# Patient Record
Sex: Female | Born: 1975 | Race: White | Hispanic: No | Marital: Married | Smoking: Current every day smoker
Health system: Southern US, Community
[De-identification: ages and names within clinical notes are randomized; demographics above are authoritative.]

## PROBLEM LIST (undated history)

## (undated) DIAGNOSIS — R0600 Dyspnea, unspecified: Secondary | ICD-10-CM

## (undated) DIAGNOSIS — F988 Other specified behavioral and emotional disorders with onset usually occurring in childhood and adolescence: Secondary | ICD-10-CM

## (undated) DIAGNOSIS — F429 Obsessive-compulsive disorder, unspecified: Secondary | ICD-10-CM

## (undated) DIAGNOSIS — I10 Essential (primary) hypertension: Secondary | ICD-10-CM

## (undated) DIAGNOSIS — F319 Bipolar disorder, unspecified: Secondary | ICD-10-CM

## (undated) DIAGNOSIS — M51369 Other intervertebral disc degeneration, lumbar region without mention of lumbar back pain or lower extremity pain: Secondary | ICD-10-CM

## (undated) DIAGNOSIS — K449 Diaphragmatic hernia without obstruction or gangrene: Secondary | ICD-10-CM

## (undated) DIAGNOSIS — N809 Endometriosis, unspecified: Secondary | ICD-10-CM

## (undated) DIAGNOSIS — E0590 Thyrotoxicosis, unspecified without thyrotoxic crisis or storm: Secondary | ICD-10-CM

## (undated) DIAGNOSIS — J189 Pneumonia, unspecified organism: Secondary | ICD-10-CM

## (undated) DIAGNOSIS — D649 Anemia, unspecified: Secondary | ICD-10-CM

## (undated) DIAGNOSIS — J45909 Unspecified asthma, uncomplicated: Secondary | ICD-10-CM

## (undated) DIAGNOSIS — E7800 Pure hypercholesterolemia, unspecified: Secondary | ICD-10-CM

## (undated) DIAGNOSIS — D219 Benign neoplasm of connective and other soft tissue, unspecified: Secondary | ICD-10-CM

## (undated) DIAGNOSIS — M1990 Unspecified osteoarthritis, unspecified site: Secondary | ICD-10-CM

## (undated) DIAGNOSIS — M5136 Other intervertebral disc degeneration, lumbar region: Secondary | ICD-10-CM

## (undated) DIAGNOSIS — M5134 Other intervertebral disc degeneration, thoracic region: Secondary | ICD-10-CM

## (undated) DIAGNOSIS — R911 Solitary pulmonary nodule: Secondary | ICD-10-CM

## (undated) DIAGNOSIS — K219 Gastro-esophageal reflux disease without esophagitis: Secondary | ICD-10-CM

## (undated) HISTORY — DX: Unspecified asthma, uncomplicated: J45.909

## (undated) HISTORY — DX: Benign neoplasm of connective and other soft tissue, unspecified: D21.9

## (undated) HISTORY — DX: Thyrotoxicosis, unspecified without thyrotoxic crisis or storm: E05.90

## (undated) HISTORY — PX: UPPER GASTROINTESTINAL ENDOSCOPY: SHX188

## (undated) HISTORY — DX: Obsessive-compulsive disorder, unspecified: F42.9

## (undated) HISTORY — DX: Other intervertebral disc degeneration, thoracic region: M51.34

## (undated) HISTORY — PX: ABDOMINAL HYSTERECTOMY: SHX81

## (undated) HISTORY — PX: LAPAROSCOPIC ABDOMINAL EXPLORATION: SHX6249

## (undated) HISTORY — DX: Diaphragmatic hernia without obstruction or gangrene: K44.9

## (undated) HISTORY — PX: CHOLECYSTECTOMY: SHX55

## (undated) HISTORY — DX: Endometriosis, unspecified: N80.9

## (undated) HISTORY — DX: Pneumonia, unspecified organism: J18.9

## (undated) HISTORY — DX: Dyspnea, unspecified: R06.00

## (undated) HISTORY — DX: Other specified behavioral and emotional disorders with onset usually occurring in childhood and adolescence: F98.8

## (undated) HISTORY — DX: Other intervertebral disc degeneration, lumbar region: M51.36

## (undated) HISTORY — PX: COLONOSCOPY: SHX174

## (undated) HISTORY — DX: Anemia, unspecified: D64.9

## (undated) HISTORY — DX: Unspecified osteoarthritis, unspecified site: M19.90

## (undated) HISTORY — DX: Solitary pulmonary nodule: R91.1

## (undated) HISTORY — DX: Other intervertebral disc degeneration, lumbar region without mention of lumbar back pain or lower extremity pain: M51.369

---

## 2011-04-21 DIAGNOSIS — J40 Bronchitis, not specified as acute or chronic: Secondary | ICD-10-CM | POA: Insufficient documentation

## 2011-10-15 DIAGNOSIS — N898 Other specified noninflammatory disorders of vagina: Secondary | ICD-10-CM | POA: Insufficient documentation

## 2011-10-15 DIAGNOSIS — B3731 Acute candidiasis of vulva and vagina: Secondary | ICD-10-CM | POA: Insufficient documentation

## 2012-10-23 DIAGNOSIS — M51369 Other intervertebral disc degeneration, lumbar region without mention of lumbar back pain or lower extremity pain: Secondary | ICD-10-CM | POA: Insufficient documentation

## 2012-10-23 DIAGNOSIS — M5136 Other intervertebral disc degeneration, lumbar region: Secondary | ICD-10-CM | POA: Insufficient documentation

## 2021-08-03 ENCOUNTER — Emergency Department (HOSPITAL_COMMUNITY): Payer: 59

## 2021-08-03 ENCOUNTER — Emergency Department (HOSPITAL_COMMUNITY)
Admission: EM | Admit: 2021-08-03 | Discharge: 2021-08-03 | Disposition: A | Discharge status: Home / Self Care | Payer: 59 | Attending: Emergency Medicine | Admitting: Emergency Medicine

## 2021-08-03 ENCOUNTER — Encounter (HOSPITAL_COMMUNITY): Payer: Self-pay | Admitting: *Deleted

## 2021-08-03 ENCOUNTER — Other Ambulatory Visit: Payer: Self-pay

## 2021-08-03 DIAGNOSIS — R112 Nausea with vomiting, unspecified: Secondary | ICD-10-CM | POA: Diagnosis present

## 2021-08-03 DIAGNOSIS — U071 COVID-19: Secondary | ICD-10-CM | POA: Diagnosis not present

## 2021-08-03 DIAGNOSIS — D72829 Elevated white blood cell count, unspecified: Secondary | ICD-10-CM | POA: Insufficient documentation

## 2021-08-03 DIAGNOSIS — R197 Diarrhea, unspecified: Secondary | ICD-10-CM

## 2021-08-03 DIAGNOSIS — K2100 Gastro-esophageal reflux disease with esophagitis, without bleeding: Secondary | ICD-10-CM | POA: Insufficient documentation

## 2021-08-03 HISTORY — DX: Pure hypercholesterolemia, unspecified: E78.00

## 2021-08-03 HISTORY — DX: Gastro-esophageal reflux disease without esophagitis: K21.9

## 2021-08-03 HISTORY — DX: Bipolar disorder, unspecified: F31.9

## 2021-08-03 LAB — CBC
HCT: 44.9 % (ref 36.0–46.0)
Hemoglobin: 15.7 g/dL — ABNORMAL HIGH (ref 12.0–15.0)
MCH: 29.8 pg (ref 26.0–34.0)
MCHC: 35 g/dL (ref 30.0–36.0)
MCV: 85.4 fL (ref 80.0–100.0)
Platelets: 403 10*3/uL — ABNORMAL HIGH (ref 150–400)
RBC: 5.26 MIL/uL — ABNORMAL HIGH (ref 3.87–5.11)
RDW: 12.5 % (ref 11.5–15.5)
WBC: 16.3 10*3/uL — ABNORMAL HIGH (ref 4.0–10.5)
nRBC: 0 % (ref 0.0–0.2)

## 2021-08-03 LAB — BASIC METABOLIC PANEL
Anion gap: 12 (ref 5–15)
BUN: 9 mg/dL (ref 6–20)
CO2: 24 mmol/L (ref 22–32)
Calcium: 9.6 mg/dL (ref 8.9–10.3)
Chloride: 102 mmol/L (ref 98–111)
Creatinine, Ser: 0.75 mg/dL (ref 0.44–1.00)
GFR, Estimated: >60 mL/min (ref >60)
Glucose, Bld: 124 mg/dL — ABNORMAL HIGH (ref 70–99)
Potassium: 3.6 mmol/L (ref 3.5–5.1)
Sodium: 138 mmol/L (ref 135–145)

## 2021-08-03 LAB — HEPATIC FUNCTION PANEL
ALT: 20 U/L (ref 0–44)
AST: 20 U/L (ref 15–41)
Albumin: 4.7 g/dL (ref 3.5–5.0)
Alkaline Phosphatase: 58 U/L (ref 38–126)
Bilirubin, Direct: 0.1 mg/dL (ref 0.0–0.2)
Indirect Bilirubin: 0.4 mg/dL (ref 0.3–0.9)
Total Bilirubin: 0.5 mg/dL (ref 0.3–1.2)
Total Protein: 8.2 g/dL — ABNORMAL HIGH (ref 6.5–8.1)

## 2021-08-03 LAB — I-STAT BETA HCG BLOOD, ED (MC, WL, AP ONLY): I-stat hCG, quantitative: <5 m[IU]/mL (ref <5)

## 2021-08-03 LAB — TROPONIN I (HIGH SENSITIVITY)
Troponin I (High Sensitivity): 3 ng/L (ref <18)
Troponin I (High Sensitivity): 4 ng/L (ref <18)

## 2021-08-03 LAB — D-DIMER, QUANTITATIVE: D-Dimer, Quant: 0.33 ug/mL-FEU (ref 0.00–0.50)

## 2021-08-03 LAB — LIPASE, BLOOD: Lipase: 27 U/L (ref 11–51)

## 2021-08-03 MED ORDER — METOCLOPRAMIDE HCL 5 MG/ML IJ SOLN
10.0000 mg | Freq: Once | INTRAMUSCULAR | Status: AC
  Administered 2021-08-03: 10 mg via INTRAVENOUS
  Filled 2021-08-03: qty 2

## 2021-08-03 MED ORDER — ONDANSETRON 4 MG PO TBDP: 4.0000 mg | ORAL_TABLET | Freq: Three times a day (TID) | ORAL | 0 refills | Status: DC | PRN

## 2021-08-03 MED ORDER — LACTATED RINGERS IV BOLUS
1000.0000 mL | Freq: Once | INTRAVENOUS | Status: AC
  Administered 2021-08-03: 1000 mL via INTRAVENOUS

## 2021-08-03 MED ORDER — PANTOPRAZOLE SODIUM 40 MG IV SOLR
40.0000 mg | Freq: Once | INTRAVENOUS | Status: AC
  Administered 2021-08-03: 40 mg via INTRAVENOUS
  Filled 2021-08-03: qty 40

## 2021-08-03 MED ORDER — OMEPRAZOLE 20 MG PO CPDR: 20.0000 mg | DELAYED_RELEASE_CAPSULE | Freq: Every day | ORAL | 0 refills | Status: DC

## 2021-08-03 MED ORDER — ALUM & MAG HYDROXIDE-SIMETH 200-200-20 MG/5ML PO SUSP
30.0000 mL | Freq: Once | ORAL | Status: AC
  Administered 2021-08-03: 30 mL via ORAL
  Filled 2021-08-03: qty 30

## 2021-08-03 MED ORDER — LIDOCAINE VISCOUS HCL 2 % MT SOLN
15.0000 mL | Freq: Once | OROMUCOSAL | Status: AC
  Administered 2021-08-03: 15 mL via ORAL
  Filled 2021-08-03: qty 15

## 2021-08-03 NOTE — ED Notes (Signed)
Pt given ginger ale for po challenge per dr Maryan Rued

## 2021-08-03 NOTE — Discharge Instructions (Addendum)
Your blood test look normal today except that you were dehydrated.  Everything with your heart was normal on the blood work.  Your x-ray did not show any signs of anything unusual.  Unclear why you continue to have so much nausea vomiting and diarrhea.  Could be related to COVID.  You are being started on a nausea medication and an antacid.  You will take the antacid daily for the next month.  Also be important for you to follow-up with your regular doctor.  If you start having fever, inability to hold anything down, inability to catch her breath, passing out or other concerns please return to the emergency room.

## 2021-08-03 NOTE — ED Triage Notes (Signed)
PT here via [facility] for sternal chest pain she describes as burning.  Since then she has vomited almost every day.

## 2021-08-03 NOTE — ED Provider Notes (Signed)
[facility] EMERGENCY DEPARTMENT Provider Note   CSN: [id number] Arrival date & time: 08/03/21  [id number]     History Chief Complaint  Patient presents with   Chest Pain    Wanda Mays is a 45 y.o. female.  Patient is a 45 year old female with a history of high cholesterol, GERD, bipolar disease who is presenting today with several complaints.  Patient reports she had COVID at the end of June and since that time she has had vomiting and diarrhea every day for the last 6-8 weeks.  It varies depending on the day how many episodes she has but for the last 2 days it has been severe and she has been dry heaving and vomiting up anything she tries to eat or drink.  Also for the last 2 days she has noticed chest discomfort in the center of her chest and up into her left shoulder that she describes as a burning that seems to be worse when she is active but also notices it when she sits and lays down.  She has not been able to eat.  She has noticed significant lightheadedness if she tries to stand or walk around and feels generally fatigued.  sHe denies any blood in her emesis and occasionally will have blood streaks in her stool but has a history of hemorrhoids.  She denies any urinary complaints.  She has a burning in her upper abdomen as well.  sHe denies taking aspirin products or OCPs.  She does use tobacco daily but no drugs or alcohol.  Her mother does have a clotting disease and has had close blood clots in the past.  Also her father at the age of his 64s had his first heart attack.  She has no known heart disease.  She does report always having stomach issues but has not seen anybody and does not take any medication for it regularly.  The history is provided by the patient.  Chest Pain     Past Medical History:  Diagnosis Date   Bipolar 1 disorder ([place])    GERD (gastroesophageal reflux disease)    Hypercholesteremia     There are no problems to display for this  patient.   Past Surgical History:  Procedure Laterality Date   ABDOMINAL HYSTERECTOMY     CESAREAN SECTION     CHOLECYSTECTOMY       OB History   No obstetric history on file.     No family history on file.  Social History   Substance Use Topics   Alcohol use: Not Currently   Drug use: Not Currently    Home Medications Prior to Admission medications   Medication Sig Start Date End Date Taking? Authorizing Provider  diphenhydramine-acetaminophen (TYLENOL PM) 25-500 MG TABS tablet Take 1 tablet by mouth at bedtime as needed (sleep).   Yes [provider]    Allergies    Aspirin and Penicillins  Review of Systems   Review of Systems  Cardiovascular:  Positive for chest pain.  All other systems reviewed and are negative.  Physical Exam Updated Vital Signs BP (!) 144/91 (BP Location: Left Arm)   Pulse 88   Temp 98.2 F (36.8 C) (Oral)   Resp 18   Ht '5\' 3"'$  (1.6 m)   Wt 90.7 kg   SpO2 94%   BMI 35.43 kg/m   Physical Exam Vitals and nursing note reviewed.  Constitutional:      General: She is not in acute distress.  Appearance: She is well-developed.  HENT:     Head: Normocephalic and atraumatic.     Mouth/Throat:     Mouth: Mucous membranes are dry.  Eyes:     Pupils: Pupils are equal, round, and reactive to light.  Cardiovascular:     Rate and Rhythm: Normal rate and regular rhythm.     Heart sounds: Normal heart sounds. No murmur heard.   No friction rub.  Pulmonary:     Effort: Pulmonary effort is normal.     Breath sounds: Normal breath sounds. No wheezing or rales.  Abdominal:     General: Bowel sounds are normal. There is no distension.     Palpations: Abdomen is soft.     Tenderness: There is abdominal tenderness in the right upper quadrant and epigastric area. There is no guarding or rebound.  Musculoskeletal:        General: No tenderness. Normal range of motion.     Cervical back: Normal range of motion and neck supple.      Right lower leg: No edema.     Left lower leg: No edema.     Comments: No edema  Skin:    General: Skin is warm and dry.     Capillary Refill: Capillary refill takes less than 2 seconds.     Findings: No rash.  Neurological:     Mental Status: She is alert and oriented to person, place, and time. Mental status is at baseline.     Cranial Nerves: No cranial nerve deficit.  Psychiatric:        Mood and Affect: Mood normal.        Behavior: Behavior normal.    ED Results / Procedures / Treatments   Labs (all labs ordered are listed, but only abnormal results are displayed) Labs Reviewed  BASIC METABOLIC PANEL - Abnormal; Notable for the following components:      Result Value   Glucose, Bld 124 (*)    All other components within normal limits  CBC - Abnormal; Notable for the following components:   WBC 16.3 (*)    RBC 5.26 (*)    Hemoglobin 15.7 (*)    Platelets 403 (*)    All other components within normal limits  HEPATIC FUNCTION PANEL - Abnormal; Notable for the following components:   Total Protein 8.2 (*)    All other components within normal limits  LIPASE, BLOOD  D-DIMER, QUANTITATIVE  I-STAT BETA HCG BLOOD, ED ([facility], [facility], [facility] ONLY)  TROPONIN I (HIGH SENSITIVITY)  TROPONIN I (HIGH SENSITIVITY)    EKG EKG Interpretation  Date/Time:  Wednesday August 03 2021 07:24:11 EDT Ventricular Rate:  111 PR Interval:  138 QRS Duration: 80 QT Interval:  328 QTC Calculation: 446 R Axis:   29 Text Interpretation: Sinus tachycardia Cannot rule out Anterior infarct , age undetermined T wave abnormality, consider inferior ischemia No previous tracing Confirmed by Blanchie Dessert [id number]) on 08/03/2021 3:43:09 PM  Radiology DG Chest 2 View  Result Date: 08/03/2021 CLINICAL DATA:  Chest pain the EXAM: CHEST - 2 VIEW COMPARISON:  None. FINDINGS: The heart size and mediastinal contours are within normal limits. Both lungs are clear. The visualized skeletal structures and abdomen are  unremarkable. IMPRESSION: No acute cardiopulmonary process. Electronically Signed   By: Merilyn Baba MD   On: 08/03/2021 08:10    Procedures Procedures   Medications Ordered in ED Medications  lactated ringers bolus 1,000 mL (has no administration in time range)  metoCLOPramide (REGLAN)  injection 10 mg (has no administration in time range)  pantoprazole (PROTONIX) injection 40 mg (has no administration in time range)  alum & mag hydroxide-simeth (MAALOX/MYLANTA) 200-200-20 MG/5ML suspension 30 mL (has no administration in time range)    And  lidocaine (XYLOCAINE) 2 % viscous mouth solution 15 mL (has no administration in time range)    ED Course  I have reviewed the triage vital signs and the nursing notes.  Pertinent labs & imaging results that were available during my care of the patient were reviewed by me and considered in my medical decision making (see chart for details).    MDM Rules/Calculators/A&P                           Patient is a 45 year old female presenting today with nonspecific chest discomfort in the setting of nausea vomiting and diarrhea for the last 6 to 8 weeks.  In the last 2 days she has had significant dry heaving and unable to hold anything down.  She does describe a component of exertion as she feels that the chest pain is there when she gets up and walks around but also has it when she feels stressed and when she lays down.  She is also had some shortness of breath in the last 2 days.  She denies any new cough, fever.  She has no history of cardiac disease but her father did have heart disease at a young age.  She does use tobacco so does have some risk factor.  CXR wnl.  CMP wnl.  Delta Trop wnl.  EKG with nonspecific t-wave inversion without old to compare.  CBC with leukocytosis of 16,000 of unknown significance.  Mildly hemoconcentrated with a hemoglobin of 15.  Platelets within normal limits.  Given patient's mother's history of a clotting disorder and  having recent COVID we will check a D-dimer.  Patient is not wheezing on exam and low suspicion for pneumonia.  No EKG finding to suggest pericarditis and low likelihood of myocarditis.  Patient has no evidence of fluid overload at this time.  Suspect patient's symptoms today are related to GI. Will treat with fluids, ppi, gi cocktail and anti-emetic.  6:32 PM Patient feeling much better after the meds.  Tolerating p.o.'s.  At this time low suspicion for ACS.  Suspect a GI origin.  We will start patient on a PPI and given antiemetic.  Will get follow-up with a PCP and will need to see GI in the future.  MDM   Amount and/or Complexity of Data Reviewed Clinical lab tests: ordered and reviewed Tests in the radiology section of CPT: ordered and reviewed Tests in the medicine section of CPT: ordered and reviewed Independent visualization of images, tracings, or specimens: yes    Final Clinical Impression(s) / ED Diagnoses Final diagnoses:  Gastroesophageal reflux disease with esophagitis without hemorrhage  Nausea vomiting and diarrhea    Rx / DC Orders ED Discharge Orders          Ordered    omeprazole (PRILOSEC) 20 MG capsule  Daily        08/03/21 1835    ondansetron (ZOFRAN ODT) 4 MG disintegrating tablet  Every 8 hours PRN        08/03/21 1835             Blanchie Dessert, MD 08/03/21 1836

## 2021-08-03 NOTE — Care Management (Signed)
ED RN Care Manager met with patient and spouse in [place] to discuss assisting patient with ED f/u.  Patient reports having [employer], patient was advised to call [facility] customer service on back of card to find a Provider in network. Patient verbalized understanding. No further ED Care Management needs identified.

## 2021-08-05 ENCOUNTER — Telehealth: Payer: 59 | Admitting: Nurse Practitioner

## 2021-08-05 ENCOUNTER — Other Ambulatory Visit: Payer: Self-pay | Admitting: Nurse Practitioner

## 2021-08-05 DIAGNOSIS — U099 Post covid-19 condition, unspecified: Secondary | ICD-10-CM

## 2021-08-05 MED ORDER — LEVALBUTEROL TARTRATE 45 MCG/ACT IN AERO: 2.0000 | INHALATION_SPRAY | RESPIRATORY_TRACT | 1 refills | Status: DC | PRN

## 2021-08-05 NOTE — Progress Notes (Signed)
Virtual Visit Consent   Wanda Mays, you are scheduled for a virtual visit with Wanda Mays, [place], a [facility] provider, today.     Just as with appointments in the office, your consent must be obtained to participate.  Your consent will be active for this visit and any virtual visit you may have with one of our providers in the next 365 days.     If you have a MyChart account, a copy of this consent can be sent to you electronically.  All tele[hone visits are billed to your insurance company just like a traditional visit in the office.    As this is a virtual visit, video technology does not allow for your provider to perform a traditional examination.  This may limit your provider's ability to fully assess your condition.  If your provider identifies any concerns that need to be evaluated in person or the need to arrange testing (such as labs, EKG, etc.), we will make arrangements to do so.     Although advances in technology are sophisticated, we cannot ensure that it will always work on either your end or our end.  If the connection with a video visit is poor, the visit may have to be switched to a telephone visit.  With either a video or telephone visit, we are not always able to ensure that we have a secure connection.     I need to obtain your verbal consent now.   Are you willing to proceed with your visit today? YES   Wanda Mays has provided verbal consent on 08/05/2021 for a virtual visit (video or telephone).   Wanda Hassell Done, FNP   Date: 08/05/2021 4:44 PM   Virtual Visit via Video Note   I, Wanda Mays, connected with Wanda Mays ([id number], 44-Nov-1977) on 08/05/21 at  by a video-enabled telemedicine application and verified that I am speaking with the correct person using two identifiers.  Location: Patient: Virtual Visit Location Patient: Home Provider: Virtual Visit Location Provider: Mobile   I discussed the limitations of evaluation and  management by telemedicine and the availability of in person appointments. The patient expressed understanding and agreed to proceed.    History of Present Illness: Wanda Mays is a 45 y.o. who identifies as a female who was assigned female at birth, and is being seen today for covid symptoms.  HPI: Patient had covid in June 2022 and has been symptomatic since. She went to the ED and they told her she needed to get follow up with PCP. She is still having nausea, vomiting, sob and heart palpitations. The ED gave her zofran and told her she needs PCP.patient also needs note for work. Review of Systems  Constitutional:  Positive for malaise/fatigue. Negative for chills and fever.  HENT:  Positive for congestion and sore throat.   Respiratory:  Positive for cough.   Musculoskeletal:  Positive for myalgias.  Neurological:  Negative for headaches.  Psychiatric/Behavioral: Negative.    All other systems reviewed and are negative.  Problems:   Allergies:  Allergies  Allergen Reactions   Aspirin    Penicillins    Medications:  Current Outpatient Medications:    diphenhydramine-acetaminophen (TYLENOL PM) 25-500 MG TABS tablet, Take 1 tablet by mouth at bedtime as needed (sleep)., Disp: , Rfl:    omeprazole (PRILOSEC) 20 MG capsule, Take 1 capsule (20 mg total) by mouth daily., Disp: 20 capsule, Rfl: 0   ondansetron (ZOFRAN ODT) 4 MG disintegrating tablet, Take 1 tablet (  4 mg total) by mouth every 8 (eight) hours as needed for nausea or vomiting., Disp: 20 tablet, Rfl: 0  Observations/Objective: Patient is well-developed, well-nourished in no acute distress.  Resting comfortably  at home.  Head is normocephalic, atraumatic.  No labored breathing.  Speech is clear and coherent with logical content.  Patient is alert and oriented at baseline.  Slight cough SOB with talking  Assessment and Plan:  Wanda Mays in today with chief complaint of No chief complaint on file.   1. Long  COVID Meds as prescribed Needs FMLA papers filled out Based on what you shared with me, I feel your condition warrants further evaluation and I recommend that you be seen for a face to face visit.  Please contact your primary care physician practice to be seen. Many offices offer virtual options to be seen via video if you are not comfortable going in person to a medical facility at this time.  NOTE: You will NOT be charged for this eVisit.  If you do not have a PCP, [facility] offers a free physician referral service available at [phone]. Our trained staff has the experience, knowledge and resources to put you in touch with a physician who is right for you.    If you are having a true medical emergency please call 911.   Your e-visit answers were reviewed by a board certified advanced clinical practitioner to complete your personal care plan.  Thank you for using e-Visits.    Follow Up Instructions: I discussed the assessment and treatment plan with the patient. The patient was provided an opportunity to ask questions and all were answered. The patient agreed with the plan and demonstrated an understanding of the instructions.  A copy of instructions were sent to the patient via MyChart.  The patient was advised to call back or seek an in-person evaluation if the symptoms worsen or if the condition fails to improve as anticipated.  Time:  I spent 15 minutes with the patient via telehealth technology discussing the above problems/concerns.    Wanda Hassell Done, FNP

## 2021-08-08 DIAGNOSIS — M5030 Other cervical disc degeneration, unspecified cervical region: Secondary | ICD-10-CM | POA: Insufficient documentation

## 2021-08-22 ENCOUNTER — Emergency Department (HOSPITAL_COMMUNITY)
Admission: EM | Admit: 2021-08-22 | Discharge: 2021-08-22 | Disposition: A | Discharge status: Home / Self Care | Payer: 59 | Attending: Emergency Medicine | Admitting: Emergency Medicine

## 2021-08-22 ENCOUNTER — Emergency Department (HOSPITAL_COMMUNITY): Payer: 59

## 2021-08-22 ENCOUNTER — Encounter (HOSPITAL_COMMUNITY): Payer: Self-pay | Admitting: *Deleted

## 2021-08-22 ENCOUNTER — Other Ambulatory Visit: Payer: Self-pay

## 2021-08-22 DIAGNOSIS — R0602 Shortness of breath: Secondary | ICD-10-CM | POA: Diagnosis not present

## 2021-08-22 DIAGNOSIS — Z20822 Contact with and (suspected) exposure to covid-19: Secondary | ICD-10-CM | POA: Diagnosis not present

## 2021-08-22 DIAGNOSIS — I10 Essential (primary) hypertension: Secondary | ICD-10-CM | POA: Diagnosis not present

## 2021-08-22 DIAGNOSIS — R000 Tachycardia, unspecified: Secondary | ICD-10-CM | POA: Insufficient documentation

## 2021-08-22 DIAGNOSIS — R5383 Other fatigue: Secondary | ICD-10-CM | POA: Insufficient documentation

## 2021-08-22 DIAGNOSIS — R112 Nausea with vomiting, unspecified: Secondary | ICD-10-CM | POA: Diagnosis not present

## 2021-08-22 DIAGNOSIS — R079 Chest pain, unspecified: Secondary | ICD-10-CM | POA: Diagnosis present

## 2021-08-22 DIAGNOSIS — R531 Weakness: Secondary | ICD-10-CM | POA: Diagnosis not present

## 2021-08-22 LAB — CBC
HCT: 46.1 % — ABNORMAL HIGH (ref 36.0–46.0)
Hemoglobin: 15.7 g/dL — ABNORMAL HIGH (ref 12.0–15.0)
MCH: 29.3 pg (ref 26.0–34.0)
MCHC: 34.1 g/dL (ref 30.0–36.0)
MCV: 86.2 fL (ref 80.0–100.0)
Platelets: 509 10*3/uL — ABNORMAL HIGH (ref 150–400)
RBC: 5.35 MIL/uL — ABNORMAL HIGH (ref 3.87–5.11)
RDW: 13.3 % (ref 11.5–15.5)
WBC: 11.2 10*3/uL — ABNORMAL HIGH (ref 4.0–10.5)
nRBC: 0 % (ref 0.0–0.2)

## 2021-08-22 LAB — BASIC METABOLIC PANEL
Anion gap: 15 (ref 5–15)
BUN: 15 mg/dL (ref 6–20)
CO2: 19 mmol/L — ABNORMAL LOW (ref 22–32)
Calcium: 10.9 mg/dL — ABNORMAL HIGH (ref 8.9–10.3)
Chloride: 101 mmol/L (ref 98–111)
Creatinine, Ser: 1.33 mg/dL — ABNORMAL HIGH (ref 0.44–1.00)
GFR, Estimated: 51 mL/min — ABNORMAL LOW (ref >60)
Glucose, Bld: 117 mg/dL — ABNORMAL HIGH (ref 70–99)
Potassium: 3.5 mmol/L (ref 3.5–5.1)
Sodium: 135 mmol/L (ref 135–145)

## 2021-08-22 LAB — I-STAT BETA HCG BLOOD, ED (MC, WL, AP ONLY): I-stat hCG, quantitative: <5 m[IU]/mL (ref <5)

## 2021-08-22 LAB — RESP PANEL BY RT-PCR (FLU A&B, COVID) ARPGX2
Influenza A by PCR: NEGATIVE
Influenza B by PCR: NEGATIVE
SARS Coronavirus 2 by RT PCR: NEGATIVE

## 2021-08-22 LAB — TROPONIN I (HIGH SENSITIVITY)
Troponin I (High Sensitivity): 5 ng/L (ref <18)
Troponin I (High Sensitivity): 3 ng/L (ref <18)

## 2021-08-22 LAB — D-DIMER, QUANTITATIVE: D-Dimer, Quant: 0.31 ug/mL-FEU (ref 0.00–0.50)

## 2021-08-22 MED ORDER — SODIUM CHLORIDE 0.9 % IV BOLUS
1000.0000 mL | Freq: Once | INTRAVENOUS | Status: AC
  Administered 2021-08-22: 1000 mL via INTRAVENOUS

## 2021-08-22 NOTE — ED Provider Notes (Signed)
[facility] EMERGENCY DEPARTMENT Provider Note   CSN: [id number] Arrival date & time: 08/22/21  1314     History Chief Complaint  Patient presents with   Shortness of Breath   Chest Pain   Hypertension    Wanda Mays is a 45 y.o. female.  45 year old female with prior medical history as detailed below presents for evaluation.  Patient complains of persistent tachycardia and shortness of breath.  Patient with recent history of COVID infection in June.  Patient reports symptoms concerning for possible long COVID.  She complains of persistent weakness and fatigue.  This is associated with intermittent episodes of tachycardia and dyspnea.  Today symptoms are worse.  She reports that she has noticed recent elevations in her blood pressure.  She reports recent nausea and vomiting.  She denies recent fever.  The history is provided by the patient.  Shortness of Breath Severity:  Moderate Onset quality:  Unable to specify Timing:  Unable to specify Progression:  Unable to specify Chronicity:  Recurrent Context: activity   Associated symptoms: chest pain   Chest Pain Associated symptoms: shortness of breath   Hypertension Associated symptoms include chest pain and shortness of breath.      Past Medical History:  Diagnosis Date   Bipolar 1 disorder ([place])    GERD (gastroesophageal reflux disease)    Hypercholesteremia     There are no problems to display for this patient.   Past Surgical History:  Procedure Laterality Date   ABDOMINAL HYSTERECTOMY     CESAREAN SECTION     CHOLECYSTECTOMY       OB History   No obstetric history on file.     No family history on file.  Social History   Substance Use Topics   Alcohol use: Not Currently   Drug use: Not Currently    Home Medications Prior to Admission medications   Medication Sig Start Date End Date Taking? Authorizing Provider  albuterol (VENTOLIN HFA) 108 (90 Base) MCG/ACT inhaler Inhale 2  puffs into the lungs every 6 (six) hours as needed for wheezing or shortness of breath. 08/05/21   Hassell Done, Mary-Margaret, FNP  diphenhydramine-acetaminophen (TYLENOL PM) 25-500 MG TABS tablet Take 1 tablet by mouth at bedtime as needed (sleep).    [provider]  omeprazole (PRILOSEC) 20 MG capsule Take 1 capsule (20 mg total) by mouth daily. 08/03/21   Blanchie Dessert, MD  ondansetron (ZOFRAN ODT) 4 MG disintegrating tablet Take 1 tablet (4 mg total) by mouth every 8 (eight) hours as needed for nausea or vomiting. 08/03/21   Blanchie Dessert, MD    Allergies    Aspirin and Penicillins  Review of Systems   Review of Systems  Respiratory:  Positive for shortness of breath.   Cardiovascular:  Positive for chest pain.  All other systems reviewed and are negative.  Physical Exam Updated Vital Signs BP (!) 153/97 (BP Location: Left Arm)   Pulse (!) 144   Temp 98.1 F (36.7 C) (Oral)   Resp (!) 26   Ht '5\' 3"'$  (1.6 m)   Wt 90.7 kg   SpO2 95%   BMI 35.43 kg/m   Physical Exam Vitals and nursing note reviewed.  Constitutional:      General: She is not in acute distress.    Appearance: Normal appearance. She is well-developed.  HENT:     Head: Normocephalic and atraumatic.  Eyes:     Conjunctiva/sclera: Conjunctivae normal.     Pupils: Pupils are equal,  round, and reactive to light.  Cardiovascular:     Rate and Rhythm: Normal rate and regular rhythm.     Heart sounds: Normal heart sounds.  Pulmonary:     Effort: Pulmonary effort is normal. Tachypnea present. No respiratory distress.     Breath sounds: Normal breath sounds.  Abdominal:     General: There is no distension.     Palpations: Abdomen is soft.     Tenderness: There is no abdominal tenderness.  Musculoskeletal:        General: No deformity. Normal range of motion.     Cervical back: Normal range of motion and neck supple.  Skin:    General: Skin is warm and dry.  Neurological:     General: No focal  deficit present.     Mental Status: She is alert and oriented to person, place, and time.    ED Results / Procedures / Treatments   Labs (all labs ordered are listed, but only abnormal results are displayed) Labs Reviewed  BASIC METABOLIC PANEL - Abnormal; Notable for the following components:      Result Value   CO2 19 (*)    Glucose, Bld 117 (*)    Creatinine, Ser 1.33 (*)    Calcium 10.9 (*)    GFR, Estimated 51 (*)    All other components within normal limits  CBC - Abnormal; Notable for the following components:   WBC 11.2 (*)    RBC 5.35 (*)    Hemoglobin 15.7 (*)    HCT 46.1 (*)    Platelets 509 (*)    All other components within normal limits  RESP PANEL BY RT-PCR (FLU A&B, COVID) ARPGX2  D-DIMER, QUANTITATIVE  I-STAT BETA HCG BLOOD, ED ([facility], [facility], [facility] ONLY)  TROPONIN I (HIGH SENSITIVITY)  TROPONIN I (HIGH SENSITIVITY)    EKG EKG Interpretation  Date/Time:  Monday August 22 2021 16:30:34 EDT Ventricular Rate:  89 PR Interval:  143 QRS Duration: 73 QT Interval:  447 QTC Calculation: 544 R Axis:   64 Text Interpretation: Sinus rhythm Consider right atrial enlargement Borderline T abnormalities, diffuse leads Prolonged QT interval Confirmed by Elnora Morrison [phone]) on 08/22/2021 5:35:27 PM  Radiology DG Chest 2 View  Result Date: 08/22/2021 CLINICAL DATA:  Chest pain EXAM: CHEST - 2 VIEW COMPARISON:  08/03/2021 FINDINGS: The heart size and mediastinal contours are within normal limits. Both lungs are clear. Disc degenerative disease of the thoracic spine. IMPRESSION: No acute abnormality of the lungs. Electronically Signed   By: Eddie Candle M.D.   On: 08/22/2021 14:49    Procedures Procedures   Medications Ordered in ED Medications - No data to display  ED Course  I have reviewed the triage vital signs and the nursing notes.  Pertinent labs & imaging results that were available during my care of the patient were reviewed by me and considered in my medical  decision making (see chart for details).    MDM Rules/Calculators/A&P                           MDM  MSE complete  Wanda Mays was evaluated in Emergency Department on 08/22/2021 for the symptoms described in the history of present illness. She was evaluated in the context of the global COVID-19 pandemic, which necessitated consideration that the patient might be at risk for infection with the SARS-CoV-2 virus that causes COVID-19. Institutional protocols and algorithms that pertain to the evaluation of  patients at risk for COVID-19 are in a state of rapid change based on information released by regulatory bodies including the CDC and federal and state organizations. These policies and algorithms were followed during the patient's care in the ED.   Patient is presenting with complaint of recent increased tachycardia and increased dyspnea with exertion.  Patient reports COVID infection in June with symptoms following that that are concerning for possible development of long COVID.  Patient's presentation today is concerning for elevated heart rate with concurrent symptoms of dyspnea.  Patient is able to speak in full sentences.  She is without significant hypoxia.  Work-up will include basic labs, repeat COVID testing, CXR, D-dimer, and troponin.  Pending dispo signed out to Dr. Reather Converse.   Final Clinical Impression(s) / ED Diagnoses Final diagnoses:  Acute chest pain  General weakness    Rx / DC Orders ED Discharge Orders     None        Valarie Merino, MD 08/23/21 [phone]

## 2021-08-22 NOTE — ED Provider Notes (Signed)
Patient care signed out to follow-up troponin and COVID test.  Patient presented with feeling generally weak, shortness of breath and heart racing.  COVID in June.  COVID test result reviewed negative.  Vital signs normalized no longer tachycardic, D-dimer negative.  2 troponins negative reviewed.  Patient improved in the emergency room and stable for outpatient follow-up with cardiology and primary doctor.  Labs Reviewed  BASIC METABOLIC PANEL - Abnormal; Notable for the following components:      Result Value   CO2 19 (*)    Glucose, Bld 117 (*)    Creatinine, Ser 1.33 (*)    Calcium 10.9 (*)    GFR, Estimated 51 (*)    All other components within normal limits  CBC - Abnormal; Notable for the following components:   WBC 11.2 (*)    RBC 5.35 (*)    Hemoglobin 15.7 (*)    HCT 46.1 (*)    Platelets 509 (*)    All other components within normal limits  RESP PANEL BY RT-PCR (FLU A&B, COVID) ARPGX2  D-DIMER, QUANTITATIVE  I-STAT BETA HCG BLOOD, ED ([facility], [facility], [facility] ONLY)  TROPONIN I (HIGH SENSITIVITY)  TROPONIN I (HIGH SENSITIVITY)   Golda Acre, MD 08/22/21 [phone]

## 2021-08-22 NOTE — ED Triage Notes (Signed)
To ED for eval of sob, htn, and cp. Pt states she is under the care of her pcp for htn. States over the past week she has been tracking her bp and today it was 209/130. Pt told she has long covid. Dx with covid 6/18.  States she has nausea and vomiting. Skin w/d. Speaks in full clear sentences. Allergic to asprin. Tachy in triage at 157

## 2021-08-22 NOTE — Discharge Instructions (Addendum)
Follow-up with local doctor and local heart doctor.  Return for new or worsening signs or symptoms. Use Tylenol every 4 hours as needed for pain.  No signs of heart attack on your blood work today.

## 2021-08-23 ENCOUNTER — Other Ambulatory Visit: Payer: Self-pay

## 2021-08-23 ENCOUNTER — Encounter (HOSPITAL_BASED_OUTPATIENT_CLINIC_OR_DEPARTMENT_OTHER): Payer: Self-pay | Admitting: Emergency Medicine

## 2021-08-23 ENCOUNTER — Emergency Department (HOSPITAL_BASED_OUTPATIENT_CLINIC_OR_DEPARTMENT_OTHER)
Admission: EM | Admit: 2021-08-23 | Discharge: 2021-08-23 | Disposition: A | Discharge status: Home / Self Care | Payer: 59 | Attending: Emergency Medicine | Admitting: Emergency Medicine

## 2021-08-23 DIAGNOSIS — R5383 Other fatigue: Secondary | ICD-10-CM | POA: Diagnosis present

## 2021-08-23 DIAGNOSIS — R000 Tachycardia, unspecified: Secondary | ICD-10-CM | POA: Diagnosis not present

## 2021-08-23 DIAGNOSIS — E860 Dehydration: Secondary | ICD-10-CM

## 2021-08-23 DIAGNOSIS — R030 Elevated blood-pressure reading, without diagnosis of hypertension: Secondary | ICD-10-CM | POA: Insufficient documentation

## 2021-08-23 DIAGNOSIS — Z8616 Personal history of COVID-19: Secondary | ICD-10-CM | POA: Insufficient documentation

## 2021-08-23 DIAGNOSIS — R531 Weakness: Secondary | ICD-10-CM | POA: Diagnosis not present

## 2021-08-23 DIAGNOSIS — F419 Anxiety disorder, unspecified: Secondary | ICD-10-CM | POA: Diagnosis not present

## 2021-08-23 DIAGNOSIS — R0602 Shortness of breath: Secondary | ICD-10-CM | POA: Diagnosis not present

## 2021-08-23 DIAGNOSIS — R079 Chest pain, unspecified: Secondary | ICD-10-CM | POA: Insufficient documentation

## 2021-08-23 LAB — COMPREHENSIVE METABOLIC PANEL
ALT: 77 U/L — ABNORMAL HIGH (ref 0–44)
AST: 22 U/L (ref 15–41)
Albumin: 5.5 g/dL — ABNORMAL HIGH (ref 3.5–5.0)
Alkaline Phosphatase: 70 U/L (ref 38–126)
Anion gap: 15 (ref 5–15)
BUN: 21 mg/dL — ABNORMAL HIGH (ref 6–20)
CO2: 22 mmol/L (ref 22–32)
Calcium: 10.1 mg/dL (ref 8.9–10.3)
Chloride: 101 mmol/L (ref 98–111)
Creatinine, Ser: 1.05 mg/dL — ABNORMAL HIGH (ref 0.44–1.00)
GFR, Estimated: >60 mL/min (ref >60)
Glucose, Bld: 112 mg/dL — ABNORMAL HIGH (ref 70–99)
Potassium: 3.6 mmol/L (ref 3.5–5.1)
Sodium: 138 mmol/L (ref 135–145)
Total Bilirubin: 0.7 mg/dL (ref 0.3–1.2)
Total Protein: 9.3 g/dL — ABNORMAL HIGH (ref 6.5–8.1)

## 2021-08-23 LAB — CBC WITH DIFFERENTIAL/PLATELET
Abs Immature Granulocytes: 0.05 10*3/uL (ref 0.00–0.07)
Basophils Absolute: 0.1 10*3/uL (ref 0.0–0.1)
Basophils Relative: 0 %
Eosinophils Absolute: 0 10*3/uL (ref 0.0–0.5)
Eosinophils Relative: 0 %
HCT: 44.2 % (ref 36.0–46.0)
Hemoglobin: 15.5 g/dL — ABNORMAL HIGH (ref 12.0–15.0)
Immature Granulocytes: 0 %
Lymphocytes Relative: 20 %
Lymphs Abs: 2.3 10*3/uL (ref 0.7–4.0)
MCH: 29.9 pg (ref 26.0–34.0)
MCHC: 35.1 g/dL (ref 30.0–36.0)
MCV: 85.2 fL (ref 80.0–100.0)
Monocytes Absolute: 0.9 10*3/uL (ref 0.1–1.0)
Monocytes Relative: 8 %
Neutro Abs: 8.3 10*3/uL — ABNORMAL HIGH (ref 1.7–7.7)
Neutrophils Relative %: 72 %
Platelets: 425 10*3/uL — ABNORMAL HIGH (ref 150–400)
RBC: 5.19 MIL/uL — ABNORMAL HIGH (ref 3.87–5.11)
RDW: 13.2 % (ref 11.5–15.5)
WBC: 11.5 10*3/uL — ABNORMAL HIGH (ref 4.0–10.5)
nRBC: 0 % (ref 0.0–0.2)

## 2021-08-23 LAB — URINALYSIS, ROUTINE W REFLEX MICROSCOPIC
Bilirubin Urine: NEGATIVE
Glucose, UA: NEGATIVE mg/dL
Hgb urine dipstick: NEGATIVE
Ketones, ur: 15 mg/dL — AB
Leukocytes,Ua: NEGATIVE
Nitrite: NEGATIVE
Protein, ur: NEGATIVE mg/dL
Specific Gravity, Urine: 1.03 (ref 1.005–1.030)
pH: 5 (ref 5.0–8.0)

## 2021-08-23 LAB — MAGNESIUM: Magnesium: 1.8 mg/dL (ref 1.7–2.4)

## 2021-08-23 MED ORDER — METOCLOPRAMIDE HCL 10 MG PO TABS
10.0000 mg | ORAL_TABLET | Freq: Once | ORAL | Status: AC
  Administered 2021-08-23: 10 mg via ORAL
  Filled 2021-08-23: qty 1

## 2021-08-23 MED ORDER — SODIUM CHLORIDE 0.9 % IV BOLUS
1000.0000 mL | Freq: Once | INTRAVENOUS | Status: AC
  Administered 2021-08-23: 1000 mL via INTRAVENOUS

## 2021-08-23 MED ORDER — METOCLOPRAMIDE HCL 10 MG PO TABS: 10.0000 mg | ORAL_TABLET | Freq: Three times a day (TID) | ORAL | 0 refills | Status: DC | PRN

## 2021-08-23 MED ORDER — HYDROXYZINE HCL 25 MG PO TABS
25.0000 mg | ORAL_TABLET | Freq: Once | ORAL | Status: AC
  Administered 2021-08-23: 25 mg via ORAL
  Filled 2021-08-23: qty 1

## 2021-08-23 MED ORDER — HYDROXYZINE HCL 25 MG PO TABS: 25.0000 mg | ORAL_TABLET | Freq: Three times a day (TID) | ORAL | 0 refills | Status: DC | PRN

## 2021-08-23 NOTE — ED Triage Notes (Signed)
Reports Left sided chest pain describes as a clawing sensation inside the chest.  [facility]  Was told she has long haul covid symptoms.  Had covid on 06/11/21.  Sent by PCP concerned about lab work.

## 2021-08-23 NOTE — ED Notes (Signed)
PT walked in room and did not go below 96% O2

## 2021-08-23 NOTE — ED Provider Notes (Signed)
[facility] EMERGENCY DEPARTMENT Provider Note   CSN: [id number] Arrival date & time: 08/23/21  1032     History Chief Complaint  Patient presents with   Chest Pain    Wanda Mays is a 45 y.o. female presenting for evaluation of fatigue and generalized weakness.   Pt states she has been feeling poorly since she had covid in June of this year (2 months ago). She is having daily vomiting (~5 episodes/day) and intermittent diarrhea. She also reports intermittent sob, cp, tachycardia, and elevated BP. Today she states she is feeling more fatigued and weak. She denies fevers, cough, abd pain. She was seen yesterday at [facility] ED, and d/c after a reassuring workup. Today she called the tele-doc she has been seeing recently, who was concerned about her WBC. She is on BP meds and cymbalta, no other medications. Sxs are worse with exertion/ambulation.   Additional history obtained from chart review. Reviewed labs and workup from yesterday, WBC of 11 (improved from  wks ago), and slight elevation in SCr at 1.3, c/w mild dehydration. Otherwise workup was very reassuring. Outside labs brought with pt shows normal TSH.   HPI     Past Medical History:  Diagnosis Date   Bipolar 1 disorder ([place])    GERD (gastroesophageal reflux disease)    Hypercholesteremia     There are no problems to display for this patient.   Past Surgical History:  Procedure Laterality Date   ABDOMINAL HYSTERECTOMY     CESAREAN SECTION     CHOLECYSTECTOMY       OB History   No obstetric history on file.     No family history on file.  Social History   Substance Use Topics   Alcohol use: Not Currently   Drug use: Not Currently    Home Medications Prior to Admission medications   Medication Sig Start Date End Date Taking? Authorizing Provider  hydrOXYzine (ATARAX/VISTARIL) 25 MG tablet Take 1 tablet (25 mg total) by mouth every 8 (eight) hours as needed. 08/23/21  Yes Naarah Borgerding, PA-C   metoCLOPramide (REGLAN) 10 MG tablet Take 1 tablet (10 mg total) by mouth every 8 (eight) hours as needed for nausea. 08/23/21  Yes Leaman Abe, PA-C  albuterol (VENTOLIN HFA) 108 (90 Base) MCG/ACT inhaler Inhale 2 puffs into the lungs every 6 (six) hours as needed for wheezing or shortness of breath. 08/05/21   Hassell Done, Mary-Margaret, FNP  diphenhydramine-acetaminophen (TYLENOL PM) 25-500 MG TABS tablet Take 1 tablet by mouth at bedtime as needed (sleep).    [provider]  omeprazole (PRILOSEC) 20 MG capsule Take 1 capsule (20 mg total) by mouth daily. 08/03/21   Blanchie Dessert, MD  ondansetron (ZOFRAN ODT) 4 MG disintegrating tablet Take 1 tablet (4 mg total) by mouth every 8 (eight) hours as needed for nausea or vomiting. 08/03/21   Blanchie Dessert, MD    Allergies    Aspirin and Penicillins  Review of Systems   Review of Systems  Respiratory:  Positive for shortness of breath.   Cardiovascular:  Positive for chest pain.  Gastrointestinal:  Positive for diarrhea, nausea and vomiting.  Neurological:  Positive for weakness.  All other systems reviewed and are negative.  Physical Exam Updated Vital Signs BP 129/85   Pulse 74   Temp 98.8 F (37.1 C)   Resp 15   Ht '5\' 3"'$  (1.6 m)   Wt 108.1 kg   SpO2 97%   BMI 42.23 kg/m   Physical Exam  Vitals and nursing note reviewed.  Constitutional:      General: She is not in acute distress.    Appearance: Normal appearance. She is obese.     Comments: Resting in the bed in NAD  HENT:     Head: Normocephalic and atraumatic.     Mouth/Throat:     Mouth: Mucous membranes are dry.     Comments: MM dry Eyes:     Extraocular Movements: Extraocular movements intact.     Conjunctiva/sclera: Conjunctivae normal.     Pupils: Pupils are equal, round, and reactive to light.  Cardiovascular:     Rate and Rhythm: Normal rate and regular rhythm.     Pulses: Normal pulses.  Pulmonary:     Effort: Pulmonary effort is normal. No  respiratory distress.     Breath sounds: Normal breath sounds. No wheezing.     Comments: Speaking in full sentences.  Clear lung sounds in all fields. Abdominal:     General: There is no distension.     Palpations: Abdomen is soft. There is no mass.     Tenderness: There is no abdominal tenderness. There is no guarding or rebound.  Musculoskeletal:        General: Normal range of motion.     Cervical back: Normal range of motion and neck supple.  Skin:    General: Skin is warm and dry.     Capillary Refill: Capillary refill takes less than 2 seconds.  Neurological:     Mental Status: She is alert and oriented to person, place, and time.  Psychiatric:        Mood and Affect: Mood and affect normal.        Speech: Speech normal.        Behavior: Behavior normal.    ED Results / Procedures / Treatments   Labs (all labs ordered are listed, but only abnormal results are displayed) Labs Reviewed  CBC WITH DIFFERENTIAL/PLATELET - Abnormal; Notable for the following components:      Result Value   WBC 11.5 (*)    RBC 5.19 (*)    Hemoglobin 15.5 (*)    Platelets 425 (*)    Neutro Abs 8.3 (*)    All other components within normal limits  COMPREHENSIVE METABOLIC PANEL - Abnormal; Notable for the following components:   Glucose, Bld 112 (*)    BUN 21 (*)    Creatinine, Ser 1.05 (*)    Total Protein 9.3 (*)    Albumin 5.5 (*)    ALT 77 (*)    All other components within normal limits  URINALYSIS, ROUTINE W REFLEX MICROSCOPIC - Abnormal; Notable for the following components:   APPearance CLOUDY (*)    Ketones, ur 15 (*)    All other components within normal limits  MAGNESIUM    EKG EKG Interpretation  Date/Time:  Tuesday August 23 2021 10:46:07 EDT Ventricular Rate:  102 PR Interval:  140 QRS Duration: 87 QT Interval:  343 QTC Calculation: 447 R Axis:   46 Text Interpretation: Sinus tachycardia Borderline repolarization abnormality since last tracing no significant change  Confirmed by Noemi Chapel [phone]) on 08/23/2021 10:53:52 AM  Radiology DG Chest 2 View  Result Date: 08/22/2021 CLINICAL DATA:  Chest pain EXAM: CHEST - 2 VIEW COMPARISON:  08/03/2021 FINDINGS: The heart size and mediastinal contours are within normal limits. Both lungs are clear. Disc degenerative disease of the thoracic spine. IMPRESSION: No acute abnormality of the lungs. Electronically Signed   By:  Eddie Candle M.D.   On: 08/22/2021 14:49    Procedures Procedures   Medications Ordered in ED Medications  sodium chloride 0.9 % bolus 1,000 mL (0 mLs Intravenous Stopped 08/23/21 1233)  hydrOXYzine (ATARAX/VISTARIL) tablet 25 mg (25 mg Oral Given 08/23/21 1314)  metoCLOPramide (REGLAN) tablet 10 mg (10 mg Oral Given 08/23/21 1314)    ED Course  I have reviewed the triage vital signs and the nursing notes.  Pertinent labs & imaging results that were available during my care of the patient were reviewed by me and considered in my medical decision making (see chart for details).    MDM Rules/Calculators/A&P                           Pt presenting for evaluation of intermittent cp, sob, htn, n/v/d, and generalized weakness. On exam, pt appears nontoxic. Had reassuring w/u yesterday. However in the setting of slightly worse SCr and continued n/v and generalized weakness, will recheck labs, give fluids, and check SpO2 ambulation/orthostatics. EKG unchanged from previous.   Orthostatics showed increased in HR with standing, c/w with dehydration. No change in BP. Continued mild, nonspecific leukocytosis elevation, but without infectious sxs and considering length of sxs, doubt sepsis or concerning bacterial infection. Cmp shows improved SCr. Discussed with pt. Discussed likely combination of dehydration, anxiety, and possibly long covid. Discussed symptomatic tx, importance of pcp f/u. social work consult placed for assistance with pcp. Resources given. At this time, pt appears safe for d/c. Return  precautions given. Pt states she understands and agrees to plan.    Final Clinical Impression(s) / ED Diagnoses Final diagnoses:  Dehydration  Anxiety    Rx / DC Orders ED Discharge Orders          Ordered    metoCLOPramide (REGLAN) 10 MG tablet  Every 8 hours PRN        08/23/21 1303    hydrOXYzine (ATARAX/VISTARIL) 25 MG tablet  Every 8 hours PRN        08/23/21 1303             Franchot Heidelberg, PA-C 08/23/21 1322    Noemi Chapel, MD 09/04/21 [phone]

## 2021-08-23 NOTE — Discharge Instructions (Addendum)
Take reglan as needed for nausea or vomiting. Make sure you are staying well hydrated with water.  Use hydroxyzine as needed for anxiety.  Call the office below to set up a follow up appointment., additionally, I have reached out to our social work team to help establish with primary care.  Return to the ER with any new, worsening, or concerning symptoms.

## 2021-08-23 NOTE — ED Notes (Signed)
Pt reports ongoing tachycardia onset approx 2 weeks ago. Pt also endorses ongoing chest pain with intermittent worsening. Pt with shob and intermittent dizziness.

## 2021-08-25 DIAGNOSIS — F319 Bipolar disorder, unspecified: Secondary | ICD-10-CM | POA: Insufficient documentation

## 2021-08-25 DIAGNOSIS — E7800 Pure hypercholesterolemia, unspecified: Secondary | ICD-10-CM | POA: Insufficient documentation

## 2021-08-25 DIAGNOSIS — K219 Gastro-esophageal reflux disease without esophagitis: Secondary | ICD-10-CM | POA: Insufficient documentation

## 2021-08-30 ENCOUNTER — Ambulatory Visit (HOSPITAL_COMMUNITY)
Admission: EM | Admit: 2021-08-30 | Discharge: 2021-08-30 | Disposition: A | Discharge status: Home / Self Care | Payer: 59 | Attending: Psychiatry | Admitting: Psychiatry

## 2021-08-30 ENCOUNTER — Other Ambulatory Visit: Payer: Self-pay

## 2021-08-30 ENCOUNTER — Ambulatory Visit (HOSPITAL_COMMUNITY)
Admission: RE | Admit: 2021-08-30 | Discharge: 2021-08-30 | Disposition: A | Discharge status: Home / Self Care | Payer: 59 | Attending: Psychiatry | Admitting: Psychiatry

## 2021-08-30 DIAGNOSIS — F411 Generalized anxiety disorder: Secondary | ICD-10-CM

## 2021-08-30 MED ORDER — DIPHENHYDRAMINE-APAP (SLEEP) 25-500 MG PO TABS: 1.0000 | ORAL_TABLET | Freq: Every evening | ORAL | Status: DC | PRN

## 2021-08-30 MED ORDER — HYDROXYZINE HCL 25 MG PO TABS: 25.0000 mg | ORAL_TABLET | Freq: Three times a day (TID) | ORAL | 0 refills | Status: DC | PRN

## 2021-08-30 NOTE — ED Provider Notes (Signed)
Behavioral Health Urgent Care Medical Screening Exam  Patient Name: Wanda Mays MRN: [id number] Date of Evaluation: 08/30/21 Chief Complaint:   Diagnosis:  Final diagnoses:  Generalized anxiety disorder    History of Present illness: Wanda Wanda is a 45 y.o. female patient presented to [facility] as a walk in alone with complaints of "I need help with my anxiety".  Patient reports she has a history of bipolar disorder, anxiety, ADD, and OCD.  Wanda Wanda, 45 y.o., female patient seen face to face by this provider, consulted with Dr. Serafina Mitchell; and chart reviewed on 08/30/21.  On evaluation Wanda Wanda reports she was seen this morning at [facility] as a walk in related to her anxiety. Reports she did not feel the visit was helpful. Reports she looked up [facility] online and thought maybe we could be of assistance.  During evaluation Wanda Wanda is in sitting position in no acute distress.  She is fairly groomed and makes good eye contact.  Her speech is clear, coherent, normal rate and tone.  She is alert and oriented x4.  Reports depression and anxiety and has a congruent affect.  Reports decrease in sleep and appetite.  Her thought process is coherent and relevant; There is no indication that she is currently responding to internal/external stimuli or experiencing delusional thought content.she denies auditory and visual hallucinations.  Denies self-harm/homicidal ideation.  Endorses passive suicidal ideations.  States, "I do not feel like I would hurt myself but I would not want my situation to worsen and I get to that place".  Patient contracts for safety.  Denies access to firearms/weapons.Patient has remained calm throughout assessment and has answered questions appropriately.    Patient reports she recently moved to [place] from [place] and does not have an outpatient provider.  States her PCP currently prescribes her Cymbalta but it does not help for anxiety.  Reports her anxiety is constant  and is causing her to have chest pain at times.  States she has been seen in multiple emergency rooms related to chest pain.  Reports she has a follow-up appointment with the cardiologist this week. Denies chest pain at this time. Vital signes are stable. During evaluation patient did not specifically ask for benzodiazepines, but did request medications for bipolar and anxiety. PDMP was negative for benzodiazapine's. Explained starting new medications and medication management would need to be done on an outpatient basis.  Explained to patient that this writer could provide her with 2 weeks worth of hydroxyzine to help with anxiety until she is able to see an outpatient provider. States she does have hydroxyzine but only has a few pills left.  Provided patient with a list of outpatient psychiatric providers.  Provided patient with information for safety health and [facility] for IOP/PHP.  At this time Wanda Wanda is educated and verbalizes understanding of mental health resources and other crisis services in the community.  She is instructed to call 911 and present to the nearest emergency room should she experience any suicidal/homicidal ideation, auditory/visual/hallucinations, or detrimental worsening of her mental health condition.     Psychiatric Specialty Exam  Presentation  General Appearance:Appropriate for Environment; Casual  Eye Contact:Good  Speech:Clear and Coherent; Normal Rate  Speech Volume:Normal  Handedness:Right   Mood and Affect  Mood:Anxious; Depressed  Affect:Congruent   Thought Process  Thought Processes:Coherent  Descriptions of Associations:Intact  Orientation:Full (Time, Place and Person)  Thought Content:Logical    Hallucinations:None  Ideas of Reference:None  Suicidal Thoughts:Yes, Passive Without  Intent; Without Plan; Without Means to Carry Out  Homicidal Thoughts:No   Sensorium  Memory:Immediate Good; Recent Good; Remote  Good  Judgment:Good  Insight:Good   Executive Functions  Concentration:Good  Attention Span:Good  [place]  Language:Good   Psychomotor Activity  Psychomotor Activity:Normal   Assets  Assets:Communication Skills; Desire for Improvement; Financial Resources/Insurance; Housing; Physical Health; Transportation   Sleep  Sleep:Fair  Number of hours:  No data recorded  No data recorded  Physical Exam: Physical Exam Vitals and nursing note reviewed.  Constitutional:      General: She is not in acute distress.    Appearance: Normal appearance. She is not ill-appearing.  HENT:     Head: Normocephalic.  Eyes:     General:        Right eye: No discharge.        Left eye: No discharge.     Conjunctiva/sclera: Conjunctivae normal.  Cardiovascular:     Rate and Rhythm: Normal rate.  Pulmonary:     Effort: Pulmonary effort is normal. No respiratory distress.  Musculoskeletal:        General: Normal range of motion.     Cervical back: Normal range of motion.  Skin:    Coloration: Skin is not jaundiced or pale.  Neurological:     Mental Status: She is alert and oriented to person, place, and time.  Psychiatric:        Attention and Perception: Attention and perception normal.        Mood and Affect: Mood is anxious and depressed.        Speech: Speech normal.        Behavior: Behavior normal. Behavior is cooperative.        Thought Content: Thought content includes suicidal ideation. Thought content does not include suicidal plan.        Cognition and Memory: Cognition normal.        Judgment: Judgment normal.   Review of Systems  Constitutional: Negative.  Negative for chills and fever.  HENT: Negative.  Negative for hearing loss.   Eyes: Negative.   Respiratory: Negative.  Negative for cough.   Cardiovascular: Negative.  Negative for chest pain.  Musculoskeletal: Negative.   Skin: Negative.   Neurological: Negative.    Psychiatric/Behavioral:  Positive for depression and suicidal ideas. The patient is nervous/anxious.   Blood pressure 136/80, pulse 74, temperature 97.9 F (36.6 C), temperature source Oral, resp. rate 18, SpO2 97 %. There is no height or weight on file to calculate BMI.  Musculoskeletal: Strength & Muscle Tone: within normal limits Gait & Station: normal Patient leans: N/A   [place] MSE Discharge Disposition for Follow up and Recommendations: Based on my evaluation the patient does not appear to have an emergency medical condition and can be discharged with resources and follow up care in outpatient services for Medication Management, Partial Hospitalization Program, and Group Therapy  Provided resources for [facility] and [facility] for IOP/PHP. Provided a list of outpatient psychiatric providers and therapy.  Sent prescription for hydroxyzine 25 mg p.o. 3 times daily as needed to patient's pharmacy. 42 tablets. Educated patient not to take medications with Tylenol PM or any other medication that may cause drowsiness.   No evidence of imminent risk to self or others at present.    Patient does not meet criteria for psychiatric inpatient admission. Discussed crisis plan, support from social network, calling 911, coming to the Emergency Department, and calling Suicide Hotline.  Revonda Humphrey, NP 08/30/2021, 11:05 AM

## 2021-08-30 NOTE — Discharge Instructions (Addendum)

## 2021-08-30 NOTE — BH Assessment (Signed)
@   0840, Clinician informed by Dr. Dwyane Dee that patient presented to [facility] as a walk-in, was already assessed by the provider Winifred Olive, NP), and discharged out the building. Therefore, TTS had no involvement.

## 2021-08-30 NOTE — Progress Notes (Signed)
   08/30/21 1003  [place] (Walk-ins at [facility] only)  How Did You Hear About [place]? Self  What Is the Reason for Your Visit/Call Today? 45 yo female presenting with severe anxiety and moderate depression, Pt denies SI, HI, AVH and drug use. Pt stated that she is paranoid since moving to [place] from [facility] in March 2021. She lives with her husband and 2 youngest children and states she is afraid to leave her home, afraid the enroll her children in school (homeschooled) and not sleeping. Pt stated she has not slept in 3 days. Hx of ADHD, Bipolar, GAD and OCD. Pt has been written out of work due to anxiety and was due to start back today but says she could not face it so came to [facility] instead  How Long Has This Been Causing You Problems? > than 6 months  Have You Recently Had Any Thoughts About Hurting Yourself? No (Pt reports thoughts that "things would be easier if I wasn't here." Denies any plans or intent due to having children. Hx of past attemtps over 10 years ago and IP care at that time.)  Are You Planning to [place] At This time? No  Have you Recently Had Thoughts About [place]? No  Are You Planning To Harm Someone At This Time? No  Are you currently experiencing any auditory, visual or other hallucinations? No  Have You Used Any Alcohol or Drugs in the Past 24 Hours? No  Do you have any current medical co-morbidities that require immediate attention? No  Clinician description of patient physical appearance/behavior: Pt is casually dressed and adequately groomed. Pt is pleasant and cooperative. Pt's movement , speech and thought content appear within normal limits. Pt's mood is moderately depression but with full range of emotions.  What Do You Feel Would Help You the Most Today? Treatment for Depression or other mood problem  If access to [facility] was not available, would you have sought care in the Emergency Department? Yes  Determination of Need Routine  (7 days)  Options For Referral Outpatient Therapy;Medication Management  Venus Gilles T. Mare Ferrari, [place], [facility], [facility] Triage Specialist [facility]

## 2021-08-30 NOTE — H&P (Addendum)
Behavioral Health Medical Screening Exam  Wanda Mays is a 45 y.o. female presents to [facility] behavioral health requesting something to help with her anxiety.  She reports a history of bipolar anxiety, ADD and OCD.  States she is currently followed by primary care provider where she is prescribed metoprolol 50 mg , Hctz 25 mg  and Cymbalta 60 mg daily.  She reports taking and tolerating medications well.  Patient reports she recently moved from [place] and has not had time to find outpatient provider.  States she is attempted to follow-up with multiple virtual providers without success. " They told me I was a risk because, I attempted suicide in the past." Reported last attempt was 2 years ago. Stated that she has been out of work since June due to [employer] and stress.   Currently denying suicidal or homicidal ideations.  Denies auditory or visual hallucinations.  She reports after recent diagnosis with COVID she has been seen at the local hospital due to chest palpitations and increased anxiety twice.  Discussed initiating hydroxyzine.  Patient declined.  Patient was offered partial hospitalization- she declined. " I have too much trauma for group setting." Patient was offered additional outpatient resources " this was a waste of time."    Patient reports she was referred to the local urgent care for medications.   Advised patient urgent care was a sister affiliate.  Patient to follow-up with outpatient providers.  Case staffed with attending psychiatrist  MD Dwyane Dee, support, encouragement and reassurances was provided.   Total Time spent with patient: 15 minutes  Psychiatric Specialty Exam:  Presentation  General Appearance: Appropriate for Environment; Casual  Eye Contact:Good  Speech:Clear and Coherent; Normal Rate  Speech Volume:Normal  Handedness: Right  Mood and Affect  Mood:Anxious; Depressed  Affect:Congruent   Thought Process  Thought Processes:Coherent  Descriptions of  Associations:Intact  Orientation:Full (Time, Place and Person)  Thought Content:Logical  History of Schizophrenia/Schizoaffective disorder:No data recorded Duration of Psychotic Symptoms:No data recorded Hallucinations:Hallucinations: None  Ideas of Reference:None  Suicidal Thoughts:Suicidal Thoughts: Yes, Passive SI Passive Intent and/or Plan: Without Intent; Without Plan; Without Means to Carry Out  Homicidal Thoughts:Homicidal Thoughts: No   Sensorium  Memory:Immediate Good; Recent Good; Remote Good  Judgment:Good  Insight:Good   Executive Functions  Concentration:Good  Attention Span:Good  [place] of Knowledge:Good  Language:Good   Psychomotor Activity  Psychomotor Activity:Psychomotor Activity: Normal   Assets  Assets:Communication Skills; Desire for Improvement; Financial Resources/Insurance; Housing; Physical Health; Transportation   Sleep  Sleep:Sleep: Fair    Physical Exam: Physical Exam Vitals reviewed.  HENT:     Head: Normocephalic.  Cardiovascular:     Rate and Rhythm: Normal rate and regular rhythm.  Pulmonary:     Effort: Pulmonary effort is normal.     Breath sounds: Normal breath sounds.  Abdominal:     Palpations: Abdomen is soft.  Neurological:     Mental Status: She is alert.  Psychiatric:        Attention and Perception: Attention and perception normal.        Mood and Affect: Mood normal.        Speech: Speech normal.        Behavior: Behavior normal. Behavior is cooperative.        Thought Content: Thought content normal.        Cognition and Memory: Cognition normal.        Judgment: Judgment normal.   ROS There were no vitals taken for this visit.  There is no height or weight on file to calculate BMI. 97.8, 120/84, 73 HR 18 RR vitals collected by RN D. Railey  Musculoskeletal: Strength & Muscle Tone: within normal limits Gait & Station: normal Patient leans: N/A   Recommendations: Keep follow-up  apptoiment with Primary Care Provider - Declined PHP/IOP,Vistaril  and additional outpatient resources Based on my evaluation the patient does not appear to have an emergency medical condition.  Derrill [place], NP 08/30/2021, 10:42 AM

## 2021-09-02 ENCOUNTER — Ambulatory Visit (INDEPENDENT_AMBULATORY_CARE_PROVIDER_SITE_OTHER): Payer: 59

## 2021-09-02 ENCOUNTER — Other Ambulatory Visit: Payer: Self-pay

## 2021-09-02 ENCOUNTER — Ambulatory Visit (INDEPENDENT_AMBULATORY_CARE_PROVIDER_SITE_OTHER): Payer: 59 | Admitting: Cardiology

## 2021-09-02 ENCOUNTER — Encounter: Payer: Self-pay | Admitting: Cardiology

## 2021-09-02 VITALS — BP 167/105 | HR 99 | Ht 63.0 in | Wt 215.1 lb

## 2021-09-02 DIAGNOSIS — R0602 Shortness of breath: Secondary | ICD-10-CM

## 2021-09-02 DIAGNOSIS — R0789 Other chest pain: Secondary | ICD-10-CM

## 2021-09-02 DIAGNOSIS — R002 Palpitations: Secondary | ICD-10-CM

## 2021-09-02 DIAGNOSIS — F32A Depression, unspecified: Secondary | ICD-10-CM

## 2021-09-02 DIAGNOSIS — F419 Anxiety disorder, unspecified: Secondary | ICD-10-CM

## 2021-09-02 DIAGNOSIS — F319 Bipolar disorder, unspecified: Secondary | ICD-10-CM

## 2021-09-02 MED ORDER — PROPRANOLOL HCL 40 MG PO TABS: 40.0000 mg | ORAL_TABLET | Freq: Three times a day (TID) | ORAL | 4 refills | Status: DC

## 2021-09-02 NOTE — Patient Instructions (Signed)
Medication Instructions:  Your physician has recommended you make the following change in your medication:  STOP: Lopressor START: Propranolol 40 mg every 8 hours *If you need a refill on your cardiac medications before your next appointment, please call your pharmacy*   Lab Work: Your physician recommends that you return for lab work in:  TODAY: TSH, BMET, [place] If you have labs (blood work) drawn today and your tests are completely normal, you will receive your results only by: [place] (if you have MyChart) OR A paper copy in the mail If you have any lab test that is abnormal or we need to change your treatment, we will call you to review the results.   Testing/Procedures: Your physician has requested that you have an echocardiogram. Echocardiography is a painless test that uses sound waves to create images of your heart. It provides your doctor with information about the size and shape of your heart and how well your heart's chambers and valves are working. This procedure takes approximately one hour. There are no restrictions for this procedure.  A zio monitor was ordered today. It will remain on for 3 days. You will then return monitor and event diary in provided box. It takes 1-2 weeks for report to be downloaded and returned to [place]. We will call you with the results. If monitor falls off or has orange flashing light, please call Zio for further instructions.   ZIO  WHY IS MY DOCTOR PRESCRIBING ZIO? The Zio system is proven and trusted by physicians to detect and diagnose irregular heart rhythms -- and has been prescribed to hundreds of thousands of patients.  The FDA has cleared the Zio system to monitor for many different kinds of irregular heart rhythms. In a study, physicians were able to reach a diagnosis 90% of the time with the Zio system1.  You can wear the Zio monitor -- a small, discreet, comfortable patch -- during your normal day-to-day activity, including while you  sleep, shower, and exercise, while it records every single heartbeat for analysis.  1Barrett, P., et al. Comparison of 24 Hour Holter Monitoring Versus 14 Day Novel Adhesive Patch Electrocardiographic Monitoring. [place], 2014.  ZIO VS. HOLTER MONITORING The Zio monitor can be comfortably worn for up to 14 days. Holter monitors can be worn for 24 to 48 hours, limiting the time to record any irregular heart rhythms you may have. Zio is able to capture data for the 51% of patients who have their first symptom-triggered arrhythmia after 48 hours.1  LIVE WITHOUT RESTRICTIONS The Zio ambulatory cardiac monitor is a small, unobtrusive, and water-resistant patch--you might even forget you're wearing it. The Zio monitor records and stores every beat of your heart, whether you're sleeping, working out, or showering. Remove after 3 days.     Follow-Up: At [facility], you and your health needs are our priority.  As part of our continuing mission to provide you with exceptional heart care, we have created designated Provider Care Teams.  These Care Teams include your primary Cardiologist (physician) and Advanced Practice Providers (APPs -  Physician Assistants and Nurse Practitioners) who all work together to provide you with the care you need, when you need it.  We recommend signing up for the patient portal called "MyChart".  Sign up information is provided on this After Visit Summary.  MyChart is used to connect with patients for Virtual Visits (Telemedicine).  Patients are able to view lab/test results, encounter notes, upcoming appointments, etc.  Non-urgent messages can be sent to your provider as well.   To learn more about what you can do with MyChart, go to [email or web address].    Your next appointment:   6 week(s)  The format for your next appointment:   In person  Provider:   Berniece Salines, DO [address], [place], [place] [address]    Other  Instructions Echocardiogram An echocardiogram is a test that uses sound waves (ultrasound) to produce images of the heart. Images from an echocardiogram can provide important information about: Heart size and shape. The size and thickness and movement of your heart's walls. Heart muscle function and strength. Heart valve function or if you have stenosis. Stenosis is when the heart valves are too narrow. If blood is flowing backward through the heart valves (regurgitation). A tumor or infectious growth around the heart valves. Areas of heart muscle that are not working well because of poor blood flow or injury from a heart attack. Aneurysm detection. An aneurysm is a weak or damaged part of an artery wall. The wall bulges out from the normal force of blood pumping through the body. Tell a health care provider about: Any allergies you have. All medicines you are taking, including vitamins, herbs, eye drops, creams, and over-the-counter medicines. Any blood disorders you have. Any surgeries you have had. Any medical conditions you have. Whether you are pregnant or may be pregnant. What are the risks? Generally, this is a safe test. However, problems may occur, including an allergic reaction to dye (contrast) that may be used during the test. What happens before the test? No specific preparation is needed. You may eat and drink normally. What happens during the test?  You will take off your clothes from the waist up and put on a hospital gown. Electrodes or electrocardiogram (ECG)patches may be placed on your chest. The electrodes or patches are then connected to a device that monitors your heart rate and rhythm. You will lie down on a table for an ultrasound exam. A gel will be applied to your chest to help sound waves pass through your skin. A handheld device, called a transducer, will be pressed against your chest and moved over your heart. The transducer produces sound waves that travel  to your heart and bounce back (or "echo" back) to the transducer. These sound waves will be captured in real-time and changed into images of your heart that can be viewed on a video monitor. The images will be recorded on a computer and reviewed by your health care provider. You may be asked to change positions or hold your breath for a short time. This makes it easier to get different views or better views of your heart. In some cases, you may receive contrast through an IV in one of your veins. This can improve the quality of the pictures from your heart. The procedure may vary among health care providers and hospitals. What can I expect after the test? You may return to your normal, everyday life, including diet, activities, and medicines, unless your health care provider tells you not to do that. Follow these instructions at home: It is up to you to get the results of your test. Ask your health care provider, or the department that is doing the test, when your results will be ready. Keep all follow-up visits. This is important. Summary An echocardiogram is a test that uses sound waves (ultrasound) to produce images of the heart. Images from an echocardiogram can provide important  information about the size and shape of your heart, heart muscle function, heart valve function, and other possible heart problems. You do not need to do anything to prepare before this test. You may eat and drink normally. After the echocardiogram is completed, you may return to your normal, everyday life, unless your health care provider tells you not to do that. This information is not intended to replace advice given to you by your health care provider. Make sure you discuss any questions you have with your health care provider. Document Revised: 08/03/2020 Document Reviewed: 08/03/2020 Elsevier Patient Education  2022 [employer].

## 2021-09-02 NOTE — Progress Notes (Signed)
Cardiology Office Note:    Date:  09/02/2021   ID:  Wanda Mays, DOB March 04, 1976, MRN [id number]  PCP:  Toniann Ket, MD  Cardiologist:  Berniece Salines, DO  Electrophysiologist:  None   Referring MD: Toniann Ket, MD   " I have been having episodes significant chest palpitations and vomiting"  History of Present Illness:    Wanda Mays is a 45 y.o. female with a hx of bipolar disorder, GERD, hypertension, hyperlipidemia, family history of premature coronary artery disease is here today to be evaluated for palpitations and shortness of breath.  The patient tells me that she has been experiencing significant palpitations.  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  She tells me that she get these episodes of abrupt palpitations which leads sometimes to vomiting.  She gets short of breath with this.  He has visited the emergency department multiple times.  Her most recent episode was on August 30, 2021.  At that time the patient reported that she felt her anxiety was getting worse as she had had a particularly stressful day.  She tells me when she had that palpitation and her anxiety started to worsen she started to have chest tightness.  He was treated for anxiety and discharged home.  Past Medical History:  Diagnosis Date   Bipolar 1 disorder ([place])    GERD (gastroesophageal reflux disease)    Hypercholesteremia     Past Surgical History:  Procedure Laterality Date   ABDOMINAL HYSTERECTOMY     CESAREAN SECTION     CHOLECYSTECTOMY      Current Medications: Current Meds  Medication Sig   ADVAIR DISKUS 250-50 MCG/ACT AEPB Inhale 1 puff into the lungs 2 (two) times daily.   albuterol (VENTOLIN HFA) 108 (90 Base) MCG/ACT inhaler Inhale 2 puffs into the lungs every 6 (six) hours as needed for wheezing or shortness of breath.   diphenhydramine-acetaminophen (TYLENOL PM) 25-500 MG TABS tablet Take 1 tablet by mouth at bedtime as  needed (sleep).   DULoxetine (CYMBALTA) 60 MG capsule Take 60 mg by mouth daily.   hydrochlorothiazide (HYDRODIURIL) 25 MG tablet Take 25 mg by mouth daily.   hydrOXYzine (ATARAX/VISTARIL) 25 MG tablet Take 1 tablet (25 mg total) by mouth 3 (three) times daily as needed for anxiety.   metoCLOPramide (REGLAN) 10 MG tablet Take 1 tablet (10 mg total) by mouth every 8 (eight) hours as needed for nausea.   omeprazole (PRILOSEC) 20 MG capsule Take 1 capsule (20 mg total) by mouth daily.   ondansetron (ZOFRAN) 4 MG tablet Take 4 mg by mouth every 8 (eight) hours as needed for nausea/vomiting.   potassium chloride (KLOR-CON) 10 MEQ tablet Take 10 mEq by mouth daily.   propranolol (INDERAL) 40 MG tablet Take 1 tablet (40 mg total) by mouth every 8 (eight) hours.   [DISCONTINUED] metoprolol tartrate (LOPRESSOR) 50 MG tablet Take 50 mg by mouth 2 (two) times daily.   [DISCONTINUED] ondansetron (ZOFRAN ODT) 4 MG disintegrating tablet Take 1 tablet (4 mg total) by mouth every 8 (eight) hours as needed for nausea or vomiting.     Allergies:   Aspirin and Penicillins   Social History   Socioeconomic History   Marital status: Married    Spouse name: Not on file   Number of children: Not on file   Years of education: Not on file   Highest education level: Not on file  Occupational History  Not on file  Tobacco Use   Smoking status: Every Day    Packs/day: 1.00    Years: 1.00    Pack years: 1.00    Types: Cigarettes    Passive exposure: Current   Smokeless tobacco: Never  Vaping Use   Vaping Use: Never used  Substance and Sexual Activity   Alcohol use: Not Currently   Drug use: Not Currently   Sexual activity: Not on file  Other Topics Concern   Not on file  Social History Narrative   Not on file   Social Determinants of Health   Financial Resource Strain: Not on file  Food Insecurity: Not on file  Transportation Needs: Not on file  Physical Activity: Not on file  Stress: Not on  file  Social Connections: Not on file     Family History: The patient's family history includes Clotting disorder in her mother; Diabetes Mellitus I in her paternal grandfather; Diabetes type II in her father, mother, and sister; Heart Problems in her father and paternal grandfather; Heart murmur in her brother; Hypertension in her father, mother, and sister; Supraventricular tachycardia in her maternal grandmother.  ROS:   Review of Systems  Constitution: Negative for decreased appetite, fever and weight gain.  HENT: Negative for congestion, ear discharge, hoarse voice and sore throat.   Eyes: Negative for discharge, redness, vision loss in right eye and visual halos.  Cardiovascular: Report palpitations.  Negative for chest pain, dyspnea on exertion, leg swelling, orthopnea.  Respiratory: Negative for cough, hemoptysis, shortness of breath and snoring.   Endocrine: Negative for heat intolerance and polyphagia.  Hematologic/Lymphatic: Negative for bleeding problem. Does not bruise/bleed easily.  Skin: Negative for flushing, nail changes, rash and suspicious lesions.  Musculoskeletal: Negative for arthritis, joint pain, muscle cramps, myalgias, neck pain and stiffness.  Gastrointestinal: Negative for abdominal pain, bowel incontinence, diarrhea and excessive appetite.  Genitourinary: Negative for decreased libido, genital sores and incomplete emptying.  Neurological: Negative for brief paralysis, focal weakness, headaches and loss of balance.  Psychiatric/Behavioral: Negative for altered mental status, depression and suicidal ideas.  Allergic/Immunologic: Negative for HIV exposure and persistent infections.    EKGs/Labs/Other Studies Reviewed:    The following studies were reviewed today:   EKG:  The ekg ordered today demonstrates sinus rhythm, heart rate 84 bpm.  Recent Labs: 08/23/2021: ALT 77; BUN 21; Creatinine, Ser 1.05; Hemoglobin 15.5; Magnesium 1.8; Platelets 425; Potassium  3.6; Sodium 138  Recent Lipid Panel No results found for: CHOL, TRIG, HDL, CHOLHDL, VLDL, LDLCALC, LDLDIRECT  Physical Exam:    VS:  BP (!) 167/105 (BP Location: Left Arm, Patient Position: Sitting)   Pulse 99   Ht '5\' 3"'$  (1.6 m)   Wt 215 lb 1.3 oz (97.6 kg)   SpO2 97%   BMI 38.10 kg/m     Wt Readings from Last 3 Encounters:  09/02/21 215 lb 1.3 oz (97.6 kg)  08/23/21 238 lb 6.4 oz (108.1 kg)  08/22/21 200 lb (90.7 kg)     GEN: Well nourished, well developed in no acute distress HEENT: Normal NECK: No JVD; No carotid bruits LYMPHATICS: No lymphadenopathy CARDIAC: S1S2 noted,RRR, no murmurs, rubs, gallops RESPIRATORY:  Clear to auscultation without rales, wheezing or rhonchi  ABDOMEN: Soft, non-tender, non-distended, +bowel sounds, no guarding. EXTREMITIES: No edema, No cyanosis, no clubbing MUSCULOSKELETAL:  No deformity  SKIN: Warm and dry NEUROLOGIC:  Alert and oriented x 3, non-focal PSYCHIATRIC:  Normal affect, good insight  ASSESSMENT:    1. Palpitations  2. Atypical chest pain   3. Anxiety   4. Shortness of breath   5. Depression, unspecified depression type   6. Bipolar 1 disorder ([place])    PLAN:     I do believe that her palpitations are anxiety related.  She has been started on Lopressor.  We will stop that and place the patient on propanolol 40 mg every 8 hours.  She will also continue to take her hydroxyzine as prescribed. Her blood pressure is elevated in the office today which also could be multifactorial as she is nervous and shaking in the office.  I will be also that the change in medication will help. Echocardiogram will also be done to assess LV/RV function and any other structure abnormalities. Chest discomfort is atypical which is associated with her anxiety attacks.  We will continue to monitor have low threshold for any progressive testing.  For now we will follow-up with the echocardiogram result.  We will get blood work for TSH, BMP and  mag  The patient is in agreement with the above plan. The patient left the office in stable condition.  The patient will follow up in 4 weeks   Medication Adjustments/Labs and Tests Ordered: Current medicines are reviewed at length with the patient today.  Concerns regarding medicines are outlined above.  Orders Placed This Encounter  Procedures   Basic Metabolic Panel (BMET)   Magnesium   TSH   Ambulatory referral to Psychology   LONG TERM MONITOR (3-14 DAYS)   EKG 12-Lead   ECHOCARDIOGRAM COMPLETE    Meds ordered this encounter  Medications   propranolol (INDERAL) 40 MG tablet    Sig: Take 1 tablet (40 mg total) by mouth every 8 (eight) hours.    Dispense:  216 tablet    Refill:  4     Patient Instructions  Medication Instructions:  Your physician has recommended you make the following change in your medication:  STOP: Lopressor START: Propranolol 40 mg every 8 hours *If you need a refill on your cardiac medications before your next appointment, please call your pharmacy*   Lab Work: Your physician recommends that you return for lab work in:  TODAY: TSH, BMET, [place] If you have labs (blood work) drawn today and your tests are completely normal, you will receive your results only by: [place] (if you have MyChart) OR A paper copy in the mail If you have any lab test that is abnormal or we need to change your treatment, we will call you to review the results.   Testing/Procedures: Your physician has requested that you have an echocardiogram. Echocardiography is a painless test that uses sound waves to create images of your heart. It provides your doctor with information about the size and shape of your heart and how well your heart's chambers and valves are working. This procedure takes approximately one hour. There are no restrictions for this procedure.  A zio monitor was ordered today. It will remain on for 3 days. You will then return monitor and event diary in  provided box. It takes 1-2 weeks for report to be downloaded and returned to [place]. We will call you with the results. If monitor falls off or has orange flashing light, please call Zio for further instructions.   ZIO  WHY IS MY DOCTOR PRESCRIBING ZIO? The Zio system is proven and trusted by physicians to detect and diagnose irregular heart rhythms -- and has been prescribed to hundreds of thousands of patients.  The  FDA has cleared the Zio system to monitor for many different kinds of irregular heart rhythms. In a study, physicians were able to reach a diagnosis 90% of the time with the Zio system1.  You can wear the Zio monitor -- a small, discreet, comfortable patch -- during your normal day-to-day activity, including while you sleep, shower, and exercise, while it records every single heartbeat for analysis.  1Barrett, P., et al. Comparison of 24 Hour Holter Monitoring Versus 14 Day Novel Adhesive Patch Electrocardiographic Monitoring. [place], 2014.  ZIO VS. HOLTER MONITORING The Zio monitor can be comfortably worn for up to 14 days. Holter monitors can be worn for 24 to 48 hours, limiting the time to record any irregular heart rhythms you may have. Zio is able to capture data for the 51% of patients who have their first symptom-triggered arrhythmia after 48 hours.1  LIVE WITHOUT RESTRICTIONS The Zio ambulatory cardiac monitor is a small, unobtrusive, and water-resistant patch--you might even forget you're wearing it. The Zio monitor records and stores every beat of your heart, whether you're sleeping, working out, or showering. Remove after 3 days.     Follow-Up: At [facility], you and your health needs are our priority.  As part of our continuing mission to provide you with exceptional heart care, we have created designated Provider Care Teams.  These Care Teams include your primary Cardiologist (physician) and Advanced Practice Providers (APPs -  Physician  Assistants and Nurse Practitioners) who all work together to provide you with the care you need, when you need it.  We recommend signing up for the patient portal called "MyChart".  Sign up information is provided on this After Visit Summary.  MyChart is used to connect with patients for Virtual Visits (Telemedicine).  Patients are able to view lab/test results, encounter notes, upcoming appointments, etc.  Non-urgent messages can be sent to your provider as well.   To learn more about what you can do with MyChart, go to [email or web address].    Your next appointment:   6 week(s)  The format for your next appointment:   In person  Provider:   Berniece Salines, DO [address], [place], [place] [address]    Other Instructions Echocardiogram An echocardiogram is a test that uses sound waves (ultrasound) to produce images of the heart. Images from an echocardiogram can provide important information about: Heart size and shape. The size and thickness and movement of your heart's walls. Heart muscle function and strength. Heart valve function or if you have stenosis. Stenosis is when the heart valves are too narrow. If blood is flowing backward through the heart valves (regurgitation). A tumor or infectious growth around the heart valves. Areas of heart muscle that are not working well because of poor blood flow or injury from a heart attack. Aneurysm detection. An aneurysm is a weak or damaged part of an artery wall. The wall bulges out from the normal force of blood pumping through the body. Tell a health care provider about: Any allergies you have. All medicines you are taking, including vitamins, herbs, eye drops, creams, and over-the-counter medicines. Any blood disorders you have. Any surgeries you have had. Any medical conditions you have. Whether you are pregnant or may be pregnant. What are the risks? Generally, this is a safe test. However, problems may occur, including  an allergic reaction to dye (contrast) that may be used during the test. What happens before the test? No specific preparation is needed. You may  eat and drink normally. What happens during the test?  You will take off your clothes from the waist up and put on a hospital gown. Electrodes or electrocardiogram (ECG)patches may be placed on your chest. The electrodes or patches are then connected to a device that monitors your heart rate and rhythm. You will lie down on a table for an ultrasound exam. A gel will be applied to your chest to help sound waves pass through your skin. A handheld device, called a transducer, will be pressed against your chest and moved over your heart. The transducer produces sound waves that travel to your heart and bounce back (or "echo" back) to the transducer. These sound waves will be captured in real-time and changed into images of your heart that can be viewed on a video monitor. The images will be recorded on a computer and reviewed by your health care provider. You may be asked to change positions or hold your breath for a short time. This makes it easier to get different views or better views of your heart. In some cases, you may receive contrast through an IV in one of your veins. This can improve the quality of the pictures from your heart. The procedure may vary among health care providers and hospitals. What can I expect after the test? You may return to your normal, everyday life, including diet, activities, and medicines, unless your health care provider tells you not to do that. Follow these instructions at home: It is up to you to get the results of your test. Ask your health care provider, or the department that is doing the test, when your results will be ready. Keep all follow-up visits. This is important. Summary An echocardiogram is a test that uses sound waves (ultrasound) to produce images of the heart. Images from an echocardiogram can provide  important information about the size and shape of your heart, heart muscle function, heart valve function, and other possible heart problems. You do not need to do anything to prepare before this test. You may eat and drink normally. After the echocardiogram is completed, you may return to your normal, everyday life, unless your health care provider tells you not to do that. This information is not intended to replace advice given to you by your health care provider. Make sure you discuss any questions you have with your health care provider. Document Revised: 08/03/2020 Document Reviewed: 08/03/2020 Elsevier Patient Education  2022 [place].     Adopting a Healthy Lifestyle.  Know what a healthy weight is for you (roughly BMI <25) and aim to maintain this   Aim for 7+ servings of fruits and vegetables daily   65-80+ fluid ounces of water or unsweet tea for healthy kidneys   Limit to max 1 drink of alcohol per day; avoid smoking/tobacco   Limit animal fats in diet for cholesterol and heart health - choose grass fed whenever available   Avoid highly processed foods, and foods high in saturated/trans fats   Aim for low stress - take time to unwind and care for your mental health   Aim for 150 min of moderate intensity exercise weekly for heart health, and weights twice weekly for bone health   Aim for 7-9 hours of sleep daily   When it comes to diets, agreement about the perfect plan isnt easy to find, even among the experts. Experts at the [employer] developed an idea known as the Healthy Eating Plate. Just imagine  a plate divided into logical, healthy portions.   The emphasis is on diet quality:   Load up on vegetables and fruits - one-half of your plate: Aim for color and variety, and remember that potatoes dont count.   Go for whole grains - one-quarter of your plate: Whole wheat, barley, wheat berries, quinoa, oats, brown rice, and foods made with  them. If you want pasta, go with whole wheat pasta.   Protein power - one-quarter of your plate: Fish, chicken, beans, and nuts are all healthy, versatile protein sources. Limit red meat.   The diet, however, does go beyond the plate, offering a few other suggestions.   Use healthy plant oils, such as olive, canola, soy, corn, sunflower and peanut. Check the labels, and avoid partially hydrogenated oil, which have unhealthy trans fats.   If youre thirsty, drink water. Coffee and tea are good in moderation, but skip sugary drinks and limit milk and dairy products to one or two daily servings.   The type of carbohydrate in the diet is more important than the amount. Some sources of carbohydrates, such as vegetables, fruits, whole grains, and beans-are healthier than others.   Finally, stay active  Signed, Berniece Salines, DO  09/02/2021 11:58 AM    [place]

## 2021-09-03 ENCOUNTER — Encounter (INDEPENDENT_AMBULATORY_CARE_PROVIDER_SITE_OTHER): Payer: Self-pay

## 2021-09-03 LAB — BASIC METABOLIC PANEL
BUN/Creatinine Ratio: 17 (ref 9–23)
BUN: 13 mg/dL (ref 6–24)
CO2: 25 mmol/L (ref 20–29)
Calcium: 9.5 mg/dL (ref 8.7–10.2)
Chloride: 100 mmol/L (ref 96–106)
Creatinine, Ser: 0.77 mg/dL (ref 0.57–1.00)
Glucose: 103 mg/dL — ABNORMAL HIGH (ref 65–99)
Potassium: 3.3 mmol/L — ABNORMAL LOW (ref 3.5–5.2)
Sodium: 138 mmol/L (ref 134–144)
eGFR: 97 mL/min/{1.73_m2} (ref >59)

## 2021-09-03 LAB — TSH: TSH: 0.407 u[IU]/mL — ABNORMAL LOW (ref 0.450–4.500)

## 2021-09-03 LAB — MAGNESIUM: Magnesium: 2 mg/dL (ref 1.6–2.3)

## 2021-09-06 ENCOUNTER — Telehealth: Payer: Self-pay | Admitting: Cardiology

## 2021-09-06 ENCOUNTER — Encounter: Payer: 59 | Admitting: Physician Assistant

## 2021-09-06 ENCOUNTER — Telehealth: Payer: 59 | Admitting: Physician Assistant

## 2021-09-06 DIAGNOSIS — R7989 Other specified abnormal findings of blood chemistry: Secondary | ICD-10-CM

## 2021-09-06 DIAGNOSIS — R002 Palpitations: Secondary | ICD-10-CM | POA: Diagnosis not present

## 2021-09-06 NOTE — Patient Instructions (Signed)
[name], thank you for joining Leeanne Rio, PA-C for today's virtual visit.  While this provider is not your primary care provider (PCP), if your PCP is located in our provider database this encounter information will be shared with them immediately following your visit.  Consent: (Patient) Wanda Mays provided verbal consent for this virtual visit at the beginning of the encounter.  Current Medications:  Current Outpatient Medications:    ADVAIR DISKUS 250-50 MCG/ACT AEPB, Inhale 1 puff into the lungs 2 (two) times daily., Disp: , Rfl:    albuterol (VENTOLIN HFA) 108 (90 Base) MCG/ACT inhaler, Inhale 2 puffs into the lungs every 6 (six) hours as needed for wheezing or shortness of breath., Disp: 18 g, Rfl: 0   diphenhydramine-acetaminophen (TYLENOL PM) 25-500 MG TABS tablet, Take 1 tablet by mouth at bedtime as needed (sleep)., Disp: 14 tablet, Rfl:    DULoxetine (CYMBALTA) 60 MG capsule, Take 60 mg by mouth daily., Disp: , Rfl:    hydrochlorothiazide (HYDRODIURIL) 25 MG tablet, Take 25 mg by mouth daily., Disp: , Rfl:    hydrOXYzine (ATARAX/VISTARIL) 25 MG tablet, Take 1 tablet (25 mg total) by mouth 3 (three) times daily as needed for anxiety., Disp: 42 tablet, Rfl: 0   metoCLOPramide (REGLAN) 10 MG tablet, Take 1 tablet (10 mg total) by mouth every 8 (eight) hours as needed for nausea., Disp: 20 tablet, Rfl: 0   omeprazole (PRILOSEC) 20 MG capsule, Take 1 capsule (20 mg total) by mouth daily., Disp: 20 capsule, Rfl: 0   ondansetron (ZOFRAN) 4 MG tablet, Take 4 mg by mouth every 8 (eight) hours as needed for nausea/vomiting., Disp: , Rfl:    potassium chloride (KLOR-CON) 10 MEQ tablet, Take 10 mEq by mouth daily., Disp: , Rfl:    propranolol (INDERAL) 40 MG tablet, Take 1 tablet (40 mg total) by mouth every 8 (eight) hours., Disp: 216 tablet, Rfl: 4   Medications ordered in this encounter:  No orders of the defined types were placed in this encounter.    *If you need  refills on other medications prior to your next appointment, please contact your pharmacy*  Follow-Up: Call back or seek an in-person evaluation if the symptoms worsen or if the condition fails to improve as anticipated.  Other Instructions Please contact your Cardiologist as discussed regarding heart rate so they can make some adjustments to medication doses. I did send them a message as well but you still need to reach out.   You will be contacted by the office of [name], NP with [facility] [place] [phone]) to get scheduled for a new patient appointment. It is very important you attend this to get things under better control!   If anything acutely worsens or new symptoms (fever, shortness of breath) develop, please be seen at nearest ER.    If you have been instructed to have an in-person evaluation today at a local Urgent Care facility, please use the link below. It will take you to a list of all of our available [facility], including address, phone number and hours of operation. Please do not delay care.  [facility]  If you or a family member do not have a primary care provider, use the link below to schedule a visit and establish care. When you choose a [place] primary care physician or advanced practice provider, you gain a long-term partner in health. Find a Primary Care Provider  Learn more about [facility]'s in-office and virtual care options: [place] [facility]

## 2021-09-06 NOTE — Progress Notes (Signed)
Virtual Visit Consent   Mitsy Burkey, you are scheduled for a virtual visit with a [facility] provider today.     Just as with appointments in the office, your consent must be obtained to participate.  Your consent will be active for this visit and any virtual visit you may have with one of our providers in the next 365 days.     If you have a MyChart account, a copy of this consent can be sent to you electronically.  All virtual visits are billed to your insurance company just like a traditional visit in the office.    As this is a virtual visit, video technology does not allow for your provider to perform a traditional examination.  This may limit your provider's ability to fully assess your condition.  If your provider identifies any concerns that need to be evaluated in person or the need to arrange testing (such as labs, EKG, etc.), we will make arrangements to do so.     Although advances in technology are sophisticated, we cannot ensure that it will always work on either your end or our end.  If the connection with a video visit is poor, the visit may have to be switched to a telephone visit.  With either a video or telephone visit, we are not always able to ensure that we have a secure connection.     I need to obtain your verbal consent now.   Are you willing to proceed with your visit today?    Josiah Poellnitz has provided verbal consent on 09/06/2021 for a virtual visit (video or telephone).   Leeanne Rio, [place]   Date: 09/06/2021 5:29 PM  Virtual Visit via Video Note   I, Leeanne Rio, connected with  Beverlie Crest  ([id number], Jan 31, 1976) on 09/06/21 at  4:30 PM EDT by a video-enabled telemedicine application and verified that I am speaking with the correct person using two identifiers.  Location: Patient: Virtual Visit Location Patient: Home Provider: Virtual Visit Location Provider: Home Office   I discussed the limitations of evaluation and management by  telemedicine and the availability of in person appointments. The patient expressed understanding and agreed to proceed.    History of Present Illness: Wanda Mays is a 45 y.o. who identifies as a female who was assigned female at birth, and is being seen today for assistance regarding recent lab results and ongoing symptoms. Patient with documented history of BP I, GERD and HLD, currently without a PCP. She has been having issues over the past several months with increasing heart rate with palpitations, intermittent lightheadedness, windedness, heat intolerance with sweating. Was initially seen at ER with overall normal workup and thought to be related to anxiety levels. Was evaluated in the ER again and referred to Cardiology whom she recently saw on 09/02/2021 at which time repeat EKG was performed (stable), Holter study ordered and labs including TSH were obtained. TSH was low and giving symptoms Cardiologist wanted patient to see PCP for further evaluation and management. She is without PCP or specialist. Tried to get appointment with a local provider but earliest she could get was beginning of next month.   HPI: HPI  Problems:  Patient Active Problem List   Diagnosis Date Noted   Bipolar 1 disorder ([place]) 08/25/2021   GERD (gastroesophageal reflux disease) 08/25/2021   Hypercholesteremia 08/25/2021    Allergies:  Allergies  Allergen Reactions   Aspirin    Penicillins    Medications:  Current Outpatient  Medications:    ADVAIR DISKUS 250-50 MCG/ACT AEPB, Inhale 1 puff into the lungs 2 (two) times daily., Disp: , Rfl:    albuterol (VENTOLIN HFA) 108 (90 Base) MCG/ACT inhaler, Inhale 2 puffs into the lungs every 6 (six) hours as needed for wheezing or shortness of breath., Disp: 18 g, Rfl: 0   diphenhydramine-acetaminophen (TYLENOL PM) 25-500 MG TABS tablet, Take 1 tablet by mouth at bedtime as needed (sleep)., Disp: 14 tablet, Rfl:    DULoxetine (CYMBALTA) 60 MG capsule, Take 60 mg by mouth  daily., Disp: , Rfl:    hydrochlorothiazide (HYDRODIURIL) 25 MG tablet, Take 25 mg by mouth daily., Disp: , Rfl:    hydrOXYzine (ATARAX/VISTARIL) 25 MG tablet, Take 1 tablet (25 mg total) by mouth 3 (three) times daily as needed for anxiety., Disp: 42 tablet, Rfl: 0   metoCLOPramide (REGLAN) 10 MG tablet, Take 1 tablet (10 mg total) by mouth every 8 (eight) hours as needed for nausea., Disp: 20 tablet, Rfl: 0   omeprazole (PRILOSEC) 20 MG capsule, Take 1 capsule (20 mg total) by mouth daily., Disp: 20 capsule, Rfl: 0   ondansetron (ZOFRAN) 4 MG tablet, Take 4 mg by mouth every 8 (eight) hours as needed for nausea/vomiting., Disp: , Rfl:    potassium chloride (KLOR-CON) 10 MEQ tablet, Take 10 mEq by mouth daily., Disp: , Rfl:    propranolol (INDERAL) 40 MG tablet, Take 1 tablet (40 mg total) by mouth every 8 (eight) hours., Disp: 216 tablet, Rfl: 4  Observations/Objective: Patient is well-developed, well-nourished in no acute distress.  Resting comfortably at home.  Head is normocephalic, atraumatic.  No labored breathing. Speech is clear and coherent with logical content.  Patient is alert and oriented at baseline.   Assessment and Plan: 1. Abnormal TSH 2. Palpitations Lab Results  Component Value Date   TSH 0.407 (L) 09/02/2021  TSH level slightly low when checked by Cardiology. No free thyroid hormone levels checked. However, patient with many symptoms of hyperthyroidism -- increased anxiety, tachycardia/palpitations, heat intolerance, stool changes, etc. Needs further evaluation. Is on Propranolol TID just started by Cardiology. Recommend she reach out to Cardiology to adjust dose giving continued tachycardia. She needs more expedited evaluation with PCP and/or Endo. Contacted our [facility] Endocrinologists but they are unable to accept patient without a referral and first appointments are not for another month. Looked at PCP appointments through online scheduling and confirmed no ready  availability for patient. Spoke with colleague within [facility] who agrees to work patient in as a NP. Patient made aware. Strict ER precautions discussed.   Follow Up Instructions: I discussed the assessment and treatment plan with the patient. The patient was provided an opportunity to ask questions and all were answered. The patient agreed with the plan and demonstrated an understanding of the instructions.  A copy of instructions were sent to the patient via MyChart.  The patient was advised to call back or seek an in-person evaluation if the symptoms worsen or if the condition fails to improve as anticipated.  Time:  I spent 20 minutes with the patient via telehealth technology discussing the above problems/concerns.    Leeanne Rio, PA-C

## 2021-09-06 NOTE — Telephone Encounter (Signed)
Follow Up:     Patient said she received her lab results. She was wondering because of the results, could this be causing her problems  with her heartor could she have Sepsis. She  says have had Sepsis before.

## 2021-09-06 NOTE — Progress Notes (Signed)
Erroneous encounter- duplicate 

## 2021-09-07 ENCOUNTER — Other Ambulatory Visit: Payer: Self-pay

## 2021-09-07 DIAGNOSIS — R002 Palpitations: Secondary | ICD-10-CM | POA: Diagnosis not present

## 2021-09-07 MED ORDER — POTASSIUM CHLORIDE CRYS ER 20 MEQ PO TBCR: 20.0000 meq | EXTENDED_RELEASE_TABLET | Freq: Every day | ORAL | 0 refills | Status: DC

## 2021-09-07 NOTE — Progress Notes (Signed)
Prescription sent to pharmacy.

## 2021-09-08 ENCOUNTER — Telehealth: Payer: Self-pay

## 2021-09-08 ENCOUNTER — Encounter (HOSPITAL_COMMUNITY): Payer: Self-pay | Admitting: Emergency Medicine

## 2021-09-08 ENCOUNTER — Emergency Department (HOSPITAL_COMMUNITY): Payer: 59

## 2021-09-08 ENCOUNTER — Emergency Department (HOSPITAL_COMMUNITY)
Admission: EM | Admit: 2021-09-08 | Discharge: 2021-09-08 | Disposition: A | Discharge status: Home / Self Care | Payer: 59 | Attending: Emergency Medicine | Admitting: Emergency Medicine

## 2021-09-08 ENCOUNTER — Ambulatory Visit: Admit: 2021-09-08 | Disposition: A | Discharge status: Home / Self Care | Payer: 59

## 2021-09-08 ENCOUNTER — Other Ambulatory Visit: Payer: Self-pay

## 2021-09-08 ENCOUNTER — Telehealth: Payer: 59 | Admitting: Emergency Medicine

## 2021-09-08 DIAGNOSIS — R109 Unspecified abdominal pain: Secondary | ICD-10-CM | POA: Insufficient documentation

## 2021-09-08 DIAGNOSIS — R42 Dizziness and giddiness: Secondary | ICD-10-CM | POA: Diagnosis not present

## 2021-09-08 DIAGNOSIS — K625 Hemorrhage of anus and rectum: Secondary | ICD-10-CM

## 2021-09-08 DIAGNOSIS — K921 Melena: Secondary | ICD-10-CM | POA: Insufficient documentation

## 2021-09-08 DIAGNOSIS — R079 Chest pain, unspecified: Secondary | ICD-10-CM | POA: Diagnosis present

## 2021-09-08 DIAGNOSIS — R0602 Shortness of breath: Secondary | ICD-10-CM | POA: Insufficient documentation

## 2021-09-08 DIAGNOSIS — Z5321 Procedure and treatment not carried out due to patient leaving prior to being seen by health care provider: Secondary | ICD-10-CM | POA: Insufficient documentation

## 2021-09-08 HISTORY — DX: Essential (primary) hypertension: I10

## 2021-09-08 LAB — CBC WITH DIFFERENTIAL/PLATELET
Abs Immature Granulocytes: 0.03 10*3/uL (ref 0.00–0.07)
Basophils Absolute: 0 10*3/uL (ref 0.0–0.1)
Basophils Relative: 0 %
Eosinophils Absolute: 0.2 10*3/uL (ref 0.0–0.5)
Eosinophils Relative: 2 %
HCT: 40.3 % (ref 36.0–46.0)
Hemoglobin: 13.8 g/dL (ref 12.0–15.0)
Immature Granulocytes: 0 %
Lymphocytes Relative: 24 %
Lymphs Abs: 2.2 10*3/uL (ref 0.7–4.0)
MCH: 29.9 pg (ref 26.0–34.0)
MCHC: 34.2 g/dL (ref 30.0–36.0)
MCV: 87.4 fL (ref 80.0–100.0)
Monocytes Absolute: 0.5 10*3/uL (ref 0.1–1.0)
Monocytes Relative: 6 %
Neutro Abs: 6.2 10*3/uL (ref 1.7–7.7)
Neutrophils Relative %: 68 %
Platelets: 366 10*3/uL (ref 150–400)
RBC: 4.61 MIL/uL (ref 3.87–5.11)
RDW: 13.1 % (ref 11.5–15.5)
WBC: 9.2 10*3/uL (ref 4.0–10.5)
nRBC: 0 % (ref 0.0–0.2)

## 2021-09-08 LAB — COMPREHENSIVE METABOLIC PANEL
ALT: 27 U/L (ref 0–44)
AST: 19 U/L (ref 15–41)
Albumin: 4.1 g/dL (ref 3.5–5.0)
Alkaline Phosphatase: 53 U/L (ref 38–126)
Anion gap: 8 (ref 5–15)
BUN: 8 mg/dL (ref 6–20)
CO2: 23 mmol/L (ref 22–32)
Calcium: 9.2 mg/dL (ref 8.9–10.3)
Chloride: 104 mmol/L (ref 98–111)
Creatinine, Ser: 0.83 mg/dL (ref 0.44–1.00)
GFR, Estimated: >60 mL/min (ref >60)
Glucose, Bld: 95 mg/dL (ref 70–99)
Potassium: 4.2 mmol/L (ref 3.5–5.1)
Sodium: 135 mmol/L (ref 135–145)
Total Bilirubin: 0.4 mg/dL (ref 0.3–1.2)
Total Protein: 7 g/dL (ref 6.5–8.1)

## 2021-09-08 LAB — LIPASE, BLOOD: Lipase: 29 U/L (ref 11–51)

## 2021-09-08 LAB — TROPONIN I (HIGH SENSITIVITY): Troponin I (High Sensitivity): 3 ng/L (ref <18)

## 2021-09-08 NOTE — Progress Notes (Signed)
Virtual Visit Consent   Wanda Mays, you are scheduled for a virtual visit with a [facility] provider today.     Just as with appointments in the office, your consent must be obtained to participate.  Your consent will be active for this visit and any virtual visit you may have with one of our providers in the next 365 days.     If you have a MyChart account, a copy of this consent can be sent to you electronically.  All virtual visits are billed to your insurance company just like a traditional visit in the office.    As this is a virtual visit, video technology does not allow for your provider to perform a traditional examination.  This may limit your provider's ability to fully assess your condition.  If your provider identifies any concerns that need to be evaluated in person or the need to arrange testing (such as labs, EKG, etc.), we will make arrangements to do so.     Although advances in technology are sophisticated, we cannot ensure that it will always work on either your end or our end.  If the connection with a video visit is poor, the visit may have to be switched to a telephone visit.  With either a video or telephone visit, we are not always able to ensure that we have a secure connection.     I need to obtain your verbal consent now.   Are you willing to proceed with your visit today?    Crystel Bodkins has provided verbal consent on 09/08/2021 for a virtual visit video.   Montine Circle, PA-C   Date: 09/08/2021 11:14 AM   Virtual Visit via Video Note   I, Montine Circle, connected with  Keylani Ilyas  ([id number], 1976-05-01) on 09/08/21 at 11:15 AM EDT by a video-enabled telemedicine application and verified that I am speaking with the correct person using two identifiers.  Location: Patient: Virtual Visit Location Patient: Home Provider: Virtual Visit Location Provider: Home Office   I discussed the limitations of evaluation and management by telemedicine and the  availability of in person appointments. The patient expressed understanding and agreed to proceed.    History of Present Illness: Wanda Mays is a 45 y.o. who identifies as a female who was assigned female at birth, and is being seen today for states that she has had a "huge amount" of rectal bleeding today.  She states that it was like a "bucket full."  HPI: HPI  Problems:  Patient Active Problem List   Diagnosis Date Noted   Bipolar 1 disorder ([place]) 08/25/2021   GERD (gastroesophageal reflux disease) 08/25/2021   Hypercholesteremia 08/25/2021    Allergies:  Allergies  Allergen Reactions   Aspirin    Penicillins    Medications:  Current Outpatient Medications:    ADVAIR DISKUS 250-50 MCG/ACT AEPB, Inhale 1 puff into the lungs 2 (two) times daily., Disp: , Rfl:    albuterol (VENTOLIN HFA) 108 (90 Base) MCG/ACT inhaler, Inhale 2 puffs into the lungs every 6 (six) hours as needed for wheezing or shortness of breath., Disp: 18 g, Rfl: 0   diphenhydramine-acetaminophen (TYLENOL PM) 25-500 MG TABS tablet, Take 1 tablet by mouth at bedtime as needed (sleep)., Disp: 14 tablet, Rfl:    DULoxetine (CYMBALTA) 60 MG capsule, Take 60 mg by mouth daily., Disp: , Rfl:    hydrochlorothiazide (HYDRODIURIL) 25 MG tablet, Take 25 mg by mouth daily., Disp: , Rfl:    hydrOXYzine (ATARAX/VISTARIL) 25  MG tablet, Take 1 tablet (25 mg total) by mouth 3 (three) times daily as needed for anxiety., Disp: 42 tablet, Rfl: 0   metoCLOPramide (REGLAN) 10 MG tablet, Take 1 tablet (10 mg total) by mouth every 8 (eight) hours as needed for nausea., Disp: 20 tablet, Rfl: 0   omeprazole (PRILOSEC) 20 MG capsule, Take 1 capsule (20 mg total) by mouth daily., Disp: 20 capsule, Rfl: 0   ondansetron (ZOFRAN) 4 MG tablet, Take 4 mg by mouth every 8 (eight) hours as needed for nausea/vomiting., Disp: , Rfl:    potassium chloride (KLOR-CON) 10 MEQ tablet, Take 10 mEq by mouth daily., Disp: , Rfl:    potassium chloride SA  (KLOR-CON) 20 MEQ tablet, Take 1 tablet (20 mEq total) by mouth daily. Take for 3 days. (Total of 20 mEq), Disp: 3 tablet, Rfl: 0   propranolol (INDERAL) 40 MG tablet, Take 1 tablet (40 mg total) by mouth every 8 (eight) hours., Disp: 216 tablet, Rfl: 4  Observations/Objective: Patient is well-developed, well-nourished in no acute distress.  Resting comfortably at home.  Head is normocephalic, atraumatic.  No labored breathing.  Speech is clear and coherent with logical content.  Patient is alert and oriented at baseline.    Assessment and Plan: 1. Rectal bleeding Redirect to ED based on reported "huge amount" of rectal bleeding.  Follow Up Instructions: I discussed the assessment and treatment plan with the patient. The patient was provided an opportunity to ask questions and all were answered. The patient agreed with the plan and demonstrated an understanding of the instructions.  A copy of instructions were sent to the patient via MyChart.  The patient was advised to call back or seek an in-person evaluation if the symptoms worsen or if the condition fails to improve as anticipated.  Time:  I spent 7 minutes with the patient via telehealth technology discussing the above problems/concerns.    Montine Circle, PA-C

## 2021-09-08 NOTE — ED Provider Notes (Signed)
Emergency Medicine Provider Triage Evaluation Note  Wanda Mays , a 45 y.o. female  was evaluated in triage.  Pt complains of chest pain, abd pain, rectal bleeding.  Review of Systems  Positive: Chest pain, abd pain, rectal bleeding Negative: Vaginal bleeding  Physical Exam  BP 113/76 (BP Location: Left Arm)   Pulse 82   Temp 98.8 F (37.1 C)   Resp 17   SpO2 96%  Gen:   Awake, no distress   Resp:  Normal effort  MSK:   Moves extremities without difficulty  Other:  Abd soft and nontender  Medical Decision Making  Medically screening exam initiated at 1:35 PM.  Appropriate orders placed.  Quisha Duplantis was informed that the remainder of the evaluation will be completed by another provider, this initial triage assessment does not replace that evaluation, and the importance of remaining in the ED until their evaluation is complete.     Rodney Booze, PA-C 09/08/21 1335    Lorelle Gibbs, DO 09/08/21 1531

## 2021-09-08 NOTE — ED Triage Notes (Signed)
Pt states she has been having CP/SOB for "a while". Yesterday she started having severe abd cramping and today has bright red blood in her stool. Pt complains of dizziness.

## 2021-09-08 NOTE — Patient Instructions (Signed)
You need to be seen in the emergency department today.    Based on what you shared with me, I feel your condition warrants further evaluation as soon as possible at an Emergency department.    NOTE: There will be NO CHARGE for this Visit   If you are having a true medical emergency please call 911.      Emergency [facility]  Get Driving Directions  [id number]  [address]  [place], [place] [address]  Open 24/7/365      [facility] Emergency Department at [place]  [id number]  [place], [place] [address]  Open 24/7/365    Emergency [facility]  Get Driving Directions  [id number]  [address], [place] [address]  Open 24/7/365      [facility]  Get Driving Directions  [id number]  [address]  [place], [place] [address]  Open 24/7/365    [facility]  Emergency [place]  Get Driving Directions  [id number]  [place], [place] [address]  Open 24/7/365    [place]  Get Driving Directions  [id number]  [place], [place] [address]  Open 24/7/365    [facility]  Emergency [facility]  Get Driving Directions  [id number]  [address]  [place], [place] [address]  Open 24/7/365

## 2021-09-08 NOTE — Telephone Encounter (Signed)
I just spoke with patient and she is at the ER waiting to be treated.

## 2021-09-08 NOTE — ED Notes (Signed)
Patient called for a room, did not answer.

## 2021-09-08 NOTE — ED Notes (Signed)
Pt is stepping outside

## 2021-09-08 NOTE — Telephone Encounter (Signed)
Patient called stating she had a complete blood bowel movement.  I asked if she had any other associated symptoms, pains and she stated no.  She stated she was unsure what advice we could provide as we have not seen her as a patient.  I spoke with Rod Holler, CMA, and she stated patient could be seen in an urgent care if she thought she needed her hemoglobin checked and advised since she is not our patient  as of yet, we cannot advise her as to what to do.  I advised patient of this and patient said she may reach out to her cardiologist and let them know of this episode.  I advised patient Wanda Mays would be made aware of this for her new patient appointment with her for next Tuesday, 09/13/21.

## 2021-09-09 ENCOUNTER — Telehealth: Payer: 59 | Admitting: Physician Assistant

## 2021-09-09 ENCOUNTER — Telehealth: Payer: Self-pay | Admitting: *Deleted

## 2021-09-09 ENCOUNTER — Telehealth: Payer: 59 | Admitting: Family Medicine

## 2021-09-09 ENCOUNTER — Telehealth: Payer: Self-pay

## 2021-09-09 DIAGNOSIS — R112 Nausea with vomiting, unspecified: Secondary | ICD-10-CM

## 2021-09-09 DIAGNOSIS — K625 Hemorrhage of anus and rectum: Secondary | ICD-10-CM

## 2021-09-09 DIAGNOSIS — R1110 Vomiting, unspecified: Secondary | ICD-10-CM | POA: Diagnosis not present

## 2021-09-09 MED ORDER — ONDANSETRON HCL 4 MG PO TABS: 4.0000 mg | ORAL_TABLET | Freq: Three times a day (TID) | ORAL | 0 refills | Status: DC | PRN

## 2021-09-09 NOTE — Progress Notes (Signed)
E-Visit for Vomiting  We are sorry that you are not feeling well. Here is how we plan to help!  Based on what you have shared with me it looks like you have a Virus that is irritating your GI tract.  Vomiting is the forceful emptying of a portion of the stomach's content through the mouth.  Although nausea and vomiting can make you feel miserable, it's important to remember that these are not diseases, but rather symptoms of an underlying illness.  When we treat short term symptoms, we always caution that any symptoms that persist should be fully evaluated in a medical office.  I have prescribed a medication that will help alleviate your symptoms and allow you to stay hydrated:  Zofran 4 mg 1 tablet every 8 hours as needed for nausea and vomiting  HOME CARE: Drink clear liquids.  This is very important! Dehydration (the lack of fluid) can lead to a serious complication.  Start off with 1 tablespoon every 5 minutes for 8 hours. You may begin eating bland foods after 8 hours without vomiting.  Start with saltine crackers, white bread, rice, mashed potatoes, applesauce. After 48 hours on a bland diet, you may resume a normal diet. Try to go to sleep.  Sleep often empties the stomach and relieves the need to vomit.  GET HELP RIGHT AWAY IF:  Your symptoms do not improve or worsen within 2 days after treatment. You have a fever for over 3 days. You cannot keep down fluids after trying the medication.  MAKE SURE YOU:  Understand these instructions. Will watch your condition. Will get help right away if you are not doing well or get worse.   Thank you for choosing an e-visit.  Your e-visit answers were reviewed by a board certified advanced clinical practitioner to complete your personal care plan. Depending upon the condition, your plan could have included both over the counter or prescription medications.  Please review your pharmacy choice. Make sure the pharmacy is open so you can pick  up prescription now. If there is a problem, you may contact your provider through MyChart messaging and have the prescription routed to another pharmacy.  Your safety is important to us. If you have drug allergies check your prescription carefully.   For the next 24 hours you can use MyChart to ask questions about today's visit, request a non-urgent call back, or ask for a work or school excuse. You will get an email in the next two days asking about your experience. I hope that your e-visit has been valuable and will speed your recovery.  I provided 5 minutes of non face-to-face time during this encounter for chart review, medication and order placement, as well as and documentation.   

## 2021-09-09 NOTE — Telephone Encounter (Signed)
Pt called in states that she called in to schedule a f/u with GI and they can not get her in until Oct. Pt has a new pt appointment with [facility] 09/13/21. Pt was wondering if you could get her in sooner because she is having bleeding.

## 2021-09-09 NOTE — Telephone Encounter (Signed)
Mailed a copy of PCP information to pt to inquire about a new PCP.

## 2021-09-09 NOTE — Progress Notes (Signed)
Virtual Visit via Video Note  I connected with Wanda Mays on 09/09/21 at  2:00 PM EDT by a video enabled telemedicine application and verified that I am speaking with the correct person using two identifiers.  Location: Patient: Patient's home Provider: Provider's office  Person participating in the virtual visit: Patient and provider    I discussed the limitations of evaluation and management by telemedicine and the availability of in person appointments. The patient expressed understanding and agreed to proceed.  I discussed the assessment and treatment plan with the patient. The patient was provided an opportunity to ask questions and all were answered. The patient agreed with the plan and demonstrated an understanding of the instructions.   The patient was advised to call back or seek an in-person evaluation if the symptoms worsen or if the condition fails to improve as anticipated.  I provided 15 minutes of non-face-to-face time during this encounter.   Waldon Merl, PA-C   Subjective:    Patient ID: Wanda Mays, female    DOB: 04/22/1976, 45 y.o.   MRN: [id number]  No chief complaint on file.   45 yo F in NAD connects via video and inquires about getting a referral to different GI office. States she connected with [place] for rectal bleeding, nausea and vomiting, and was advised to go the ER. States she went yesterday, and according to her, she was discharged and was advised to follow up with GI and was given [facility] information. States she called them today and they advised her that next available appt is in months. States she called different GI office, and was ifnormed she will be seen sooner, but will need a referral. Pt states she has had and off blood in the stool x 3 days. States has on and off nausea and vomiting for the past 4 months.PT is relocating from [place] and doesn't have PCP, she is scheduled with new PCP Ruthell Rummage and sees her Tuesday, according to pt.  Denies any pallor of skin, weakness, fatigue.  Patient is in today for rectal bleeding, nausea and vomiting.   Past Medical History:  Diagnosis Date   Bipolar 1 disorder ([place])    GERD (gastroesophageal reflux disease)    Hypercholesteremia    Hypertension     Past Surgical History:  Procedure Laterality Date   ABDOMINAL HYSTERECTOMY     CESAREAN SECTION     CHOLECYSTECTOMY      Family History  Problem Relation Age of Onset   Clotting disorder Mother    Diabetes type II Mother    Hypertension Mother    Hypertension Father    Diabetes type II Father    Heart Problems Father    Hypertension Sister    Diabetes type II Sister    Heart murmur Brother    Supraventricular tachycardia Maternal Grandmother    Diabetes Mellitus I Paternal Grandfather    Heart Problems Paternal Grandfather     Social History   Socioeconomic History   Marital status: Married    Spouse name: Not on file   Number of children: Not on file   Years of education: Not on file   Highest education level: Not on file  Occupational History   Not on file  Tobacco Use   Smoking status: Every Day    Packs/day: 1.00    Years: 1.00    Pack years: 1.00    Types: Cigarettes    Passive exposure: Current   Smokeless tobacco: Never  Vaping Use   Vaping Use: Never used  Substance and Sexual Activity   Alcohol use: Not Currently   Drug use: Not Currently   Sexual activity: Not on file  Other Topics Concern   Not on file  Social History Narrative   Not on file   Social Determinants of Health   Financial Resource Strain: Not on file  Food Insecurity: Not on file  Transportation Needs: Not on file  Physical Activity: Not on file  Stress: Not on file  Social Connections: Not on file  Intimate Partner Violence: Not on file    Outpatient Medications Prior to Visit  Medication Sig Dispense Refill   ADVAIR DISKUS 250-50 MCG/ACT AEPB Inhale 1 puff into the lungs 2 (two) times daily.     albuterol  (VENTOLIN HFA) 108 (90 Base) MCG/ACT inhaler Inhale 2 puffs into the lungs every 6 (six) hours as needed for wheezing or shortness of breath. 18 g 0   diphenhydramine-acetaminophen (TYLENOL PM) 25-500 MG TABS tablet Take 1 tablet by mouth at bedtime as needed (sleep). 14 tablet    DULoxetine (CYMBALTA) 60 MG capsule Take 60 mg by mouth daily.     hydrochlorothiazide (HYDRODIURIL) 25 MG tablet Take 25 mg by mouth daily.     hydrOXYzine (ATARAX/VISTARIL) 25 MG tablet Take 1 tablet (25 mg total) by mouth 3 (three) times daily as needed for anxiety. 42 tablet 0   metoCLOPramide (REGLAN) 10 MG tablet Take 1 tablet (10 mg total) by mouth every 8 (eight) hours as needed for nausea. 20 tablet 0   omeprazole (PRILOSEC) 20 MG capsule Take 1 capsule (20 mg total) by mouth daily. 20 capsule 0   ondansetron (ZOFRAN) 4 MG tablet Take 1 tablet (4 mg total) by mouth every 8 (eight) hours as needed for nausea or vomiting. 15 tablet 0   potassium chloride (KLOR-CON) 10 MEQ tablet Take 10 mEq by mouth daily.     potassium chloride SA (KLOR-CON) 20 MEQ tablet Take 1 tablet (20 mEq total) by mouth daily. Take for 3 days. (Total of 20 mEq) 3 tablet 0   propranolol (INDERAL) 40 MG tablet Take 1 tablet (40 mg total) by mouth every 8 (eight) hours. 216 tablet 4   No facility-administered medications prior to visit.    Allergies  Allergen Reactions   Aspirin    Penicillins     Review of Systems  Constitutional:  Negative for activity change, appetite change, chills, diaphoresis, fatigue and fever.  HENT:  Negative for congestion, ear discharge, ear pain, postnasal drip, rhinorrhea, sinus pressure, sinus pain, sneezing, sore throat, tinnitus, trouble swallowing and voice change.   Respiratory:  Negative for apnea, cough, choking, chest tightness, shortness of breath, wheezing and stridor.   Cardiovascular:  Negative for chest pain, palpitations and leg swelling.  Gastrointestinal:  Positive for blood in stool, nausea  and vomiting. Negative for abdominal distention, abdominal pain, anal bleeding, constipation, diarrhea and rectal pain.  Genitourinary:  Negative for decreased urine volume, difficulty urinating, dyspareunia, dysuria, enuresis, flank pain, frequency, genital sores, hematuria, menstrual problem, pelvic pain, urgency, vaginal bleeding, vaginal discharge and vaginal pain.  Musculoskeletal:  Negative for arthralgias, back pain and myalgias.  Skin:  Negative for rash.  Neurological:  Negative for dizziness, weakness, light-headedness and headaches.  Hematological:  Negative for adenopathy.  Psychiatric/Behavioral:  Negative for agitation, behavioral problems and confusion.       Objective:    Physical Exam Constitutional:      General: She is not  in acute distress.    Appearance: Normal appearance. She is not ill-appearing, toxic-appearing or diaphoretic.  Pulmonary:     Effort: No respiratory distress.  Neurological:     Mental Status: She is alert and oriented to person, place, and time.  Psychiatric:        Mood and Affect: Mood normal.        Behavior: Behavior normal.        Thought Content: Thought content normal.        Judgment: Judgment normal.    There were no vitals taken for this visit. Wt Readings from Last 3 Encounters:  09/02/21 215 lb 1.3 oz (97.6 kg)  08/23/21 238 lb 6.4 oz (108.1 kg)  08/22/21 200 lb (90.7 kg)    Health Maintenance Due  Topic Date Due   COVID-19 Vaccine (1) Never done   HIV Screening  Never done   Hepatitis C Screening  Never done   TETANUS/TDAP  Never done   PAP SMEAR-Modifier  Never done   INFLUENZA VACCINE  Never done    There are no preventive care reminders to display for this patient.   Lab Results  Component Value Date   TSH 0.407 (L) 09/02/2021   Lab Results  Component Value Date   WBC 9.2 09/08/2021   HGB 13.8 09/08/2021   HCT 40.3 09/08/2021   MCV 87.4 09/08/2021   PLT 366 09/08/2021   Lab Results  Component Value Date    NA 135 09/08/2021   K 4.2 09/08/2021   CO2 23 09/08/2021   GLUCOSE 95 09/08/2021   BUN 8 09/08/2021   CREATININE 0.83 09/08/2021   BILITOT 0.4 09/08/2021   ALKPHOS 53 09/08/2021   AST 19 09/08/2021   ALT 27 09/08/2021   PROT 7.0 09/08/2021   ALBUMIN 4.1 09/08/2021   CALCIUM 9.2 09/08/2021   ANIONGAP 8 09/08/2021   EGFR 97 09/02/2021   No results found for: CHOL No results found for: HDL No results found for: LDLCALC No results found for: TRIG No results found for: CHOLHDL No results found for: HGBA1C     Assessment & Plan:   Problem List Items Addressed This Visit   None    No orders of the defined types were placed in this encounter.   Advised pt to call the ER at [phone] and ask if referral can be placed to different GI. Called pt back to ensure she was able to switch appt, she stated the ER did not help with changing the referral. Due to no PCP and unable to get the referral, will place the referral through this. Advised pt to establish with her pcp at scheduled appt. Advised that all notes and any info from specialist will have to go to her PCP as I can not act her PCP. She voiced understanding.  Waldon Merl, PA-C

## 2021-09-11 ENCOUNTER — Other Ambulatory Visit: Payer: Self-pay

## 2021-09-11 ENCOUNTER — Encounter (HOSPITAL_BASED_OUTPATIENT_CLINIC_OR_DEPARTMENT_OTHER): Payer: Self-pay

## 2021-09-11 ENCOUNTER — Emergency Department (HOSPITAL_BASED_OUTPATIENT_CLINIC_OR_DEPARTMENT_OTHER)
Admission: EM | Admit: 2021-09-11 | Discharge: 2021-09-11 | Disposition: A | Discharge status: Home / Self Care | Payer: 59 | Attending: Emergency Medicine | Admitting: Emergency Medicine

## 2021-09-11 DIAGNOSIS — F17210 Nicotine dependence, cigarettes, uncomplicated: Secondary | ICD-10-CM | POA: Diagnosis not present

## 2021-09-11 DIAGNOSIS — R109 Unspecified abdominal pain: Secondary | ICD-10-CM | POA: Diagnosis not present

## 2021-09-11 DIAGNOSIS — I10 Essential (primary) hypertension: Secondary | ICD-10-CM | POA: Insufficient documentation

## 2021-09-11 DIAGNOSIS — K625 Hemorrhage of anus and rectum: Secondary | ICD-10-CM | POA: Diagnosis present

## 2021-09-11 DIAGNOSIS — Z79899 Other long term (current) drug therapy: Secondary | ICD-10-CM | POA: Insufficient documentation

## 2021-09-11 LAB — CBC WITH DIFFERENTIAL/PLATELET
Abs Immature Granulocytes: 0.03 10*3/uL (ref 0.00–0.07)
Basophils Absolute: 0.1 10*3/uL (ref 0.0–0.1)
Basophils Relative: 1 %
Eosinophils Absolute: 0.2 10*3/uL (ref 0.0–0.5)
Eosinophils Relative: 2 %
HCT: 43.7 % (ref 36.0–46.0)
Hemoglobin: 15.5 g/dL — ABNORMAL HIGH (ref 12.0–15.0)
Immature Granulocytes: 0 %
Lymphocytes Relative: 27 %
Lymphs Abs: 2.6 10*3/uL (ref 0.7–4.0)
MCH: 29.8 pg (ref 26.0–34.0)
MCHC: 35.5 g/dL (ref 30.0–36.0)
MCV: 84 fL (ref 80.0–100.0)
Monocytes Absolute: 0.8 10*3/uL (ref 0.1–1.0)
Monocytes Relative: 8 %
Neutro Abs: 6.1 10*3/uL (ref 1.7–7.7)
Neutrophils Relative %: 62 %
Platelets: 412 10*3/uL — ABNORMAL HIGH (ref 150–400)
RBC: 5.2 MIL/uL — ABNORMAL HIGH (ref 3.87–5.11)
RDW: 12.8 % (ref 11.5–15.5)
WBC: 9.7 10*3/uL (ref 4.0–10.5)
nRBC: 0 % (ref 0.0–0.2)

## 2021-09-11 LAB — BASIC METABOLIC PANEL
Anion gap: 8 (ref 5–15)
BUN: 11 mg/dL (ref 6–20)
CO2: 24 mmol/L (ref 22–32)
Calcium: 9.4 mg/dL (ref 8.9–10.3)
Chloride: 102 mmol/L (ref 98–111)
Creatinine, Ser: 0.79 mg/dL (ref 0.44–1.00)
GFR, Estimated: >60 mL/min (ref >60)
Glucose, Bld: 103 mg/dL — ABNORMAL HIGH (ref 70–99)
Potassium: 3.9 mmol/L (ref 3.5–5.1)
Sodium: 134 mmol/L — ABNORMAL LOW (ref 135–145)

## 2021-09-11 LAB — OCCULT BLOOD X 1 CARD TO LAB, STOOL: Fecal Occult Bld: POSITIVE — AB

## 2021-09-11 MED ORDER — DICYCLOMINE HCL 20 MG PO TABS: 20.0000 mg | ORAL_TABLET | Freq: Two times a day (BID) | ORAL | 0 refills | Status: DC

## 2021-09-11 NOTE — ED Triage Notes (Signed)
Diarrrhea-dark red, liquid stool x4 days 3-4 times a day  Feeling weak, short of breath

## 2021-09-11 NOTE — ED Provider Notes (Signed)
[facility] EMERGENCY DEPARTMENT Provider Note  CSN: [id number] Arrival date & time: 09/11/21 0859    History Chief Complaint  Patient presents with   Rectal Bleeding    Wanda Mays is a 45 y.o. female with history of bipolar, GERD, HTN, HLD has been feeling sick for the last several weeks. She has had numerous ED visits and Telehealth visits for various complaints including palpitations, anxiety, vomiting, abdominal pain and diarrhea. In the last 4 days she has begun to have dark red blood in her stools. She was at the [facility] ED on 9/15 but left after a long wait. She had a telehealth visit the following day. She has been referred to GI previously but will not be able to see them for another couple of months. Today she noted some feeling of lightheadedness after BM around 7am. She has had mild diffuse aching abdominal pain. She has seen Cardiology for palpitations, started on propranolol, had borderline low TSH but has not been able to establish with a PCP yet.    Past Medical History:  Diagnosis Date   Bipolar 1 disorder ([place])    GERD (gastroesophageal reflux disease)    Hypercholesteremia    Hypertension     Past Surgical History:  Procedure Laterality Date   ABDOMINAL HYSTERECTOMY     CESAREAN SECTION     CHOLECYSTECTOMY      Family History  Problem Relation Age of Onset   Clotting disorder Mother    Diabetes type II Mother    Hypertension Mother    Hypertension Father    Diabetes type II Father    Heart Problems Father    Hypertension Sister    Diabetes type II Sister    Heart murmur Brother    Supraventricular tachycardia Maternal Grandmother    Diabetes Mellitus I Paternal Grandfather    Heart Problems Paternal Grandfather     Social History   Tobacco Use   Smoking status: Every Day    Packs/day: 1.00    Years: 1.00    Pack years: 1.00    Types: Cigarettes    Passive exposure: Current   Smokeless tobacco: Never  Vaping Use   Vaping Use:  Never used  Substance Use Topics   Alcohol use: Not Currently   Drug use: Not Currently     Home Medications Prior to Admission medications   Medication Sig Start Date End Date Taking? Authorizing Provider  dicyclomine (BENTYL) 20 MG tablet Take 1 tablet (20 mg total) by mouth 2 (two) times daily. 09/11/21  Yes Truddie Hidden, MD  ADVAIR DISKUS 250-50 MCG/ACT AEPB Inhale 1 puff into the lungs 2 (two) times daily. 08/12/21   [provider]  albuterol (VENTOLIN HFA) 108 (90 Base) MCG/ACT inhaler Inhale 2 puffs into the lungs every 6 (six) hours as needed for wheezing or shortness of breath. 08/05/21   Hassell Done, Mary-Margaret, FNP  diphenhydramine-acetaminophen (TYLENOL PM) 25-500 MG TABS tablet Take 1 tablet by mouth at bedtime as needed (sleep). 08/30/21   Revonda Humphrey, NP  DULoxetine (CYMBALTA) 60 MG capsule Take 60 mg by mouth daily. 08/20/21   [provider]  hydrochlorothiazide (HYDRODIURIL) 25 MG tablet Take 25 mg by mouth daily. 08/20/21   [provider]  hydrOXYzine (ATARAX/VISTARIL) 25 MG tablet Take 1 tablet (25 mg total) by mouth 3 (three) times daily as needed for anxiety. 08/30/21   Revonda Humphrey, NP  metoCLOPramide (REGLAN) 10 MG tablet Take 1 tablet (10 mg total) by  mouth every 8 (eight) hours as needed for nausea. 08/23/21   Caccavale, Sophia, PA-C  omeprazole (PRILOSEC) 20 MG capsule Take 1 capsule (20 mg total) by mouth daily. 08/03/21   Blanchie Dessert, MD  ondansetron (ZOFRAN) 4 MG tablet Take 1 tablet (4 mg total) by mouth every 8 (eight) hours as needed for nausea or vomiting. 09/09/21   Perlie Mayo, NP  potassium chloride (KLOR-CON) 10 MEQ tablet Take 10 mEq by mouth daily. 08/26/21   [provider]  potassium chloride SA (KLOR-CON) 20 MEQ tablet Take 1 tablet (20 mEq total) by mouth daily. Take for 3 days. (Total of 20 mEq) 09/07/21 12/06/21  Tobb, Godfrey Pick, DO  propranolol (INDERAL) 40 MG tablet Take 1 tablet (40 mg total) by  mouth every 8 (eight) hours. 09/02/21   Tobb, Kardie, DO     Allergies    Aspirin and Penicillins   Review of Systems   Review of Systems A comprehensive review of systems was completed and negative except as noted in HPI.    Physical Exam BP 129/90 (BP Location: Right Arm)   Pulse 66   Temp 98.2 F (36.8 C) (Oral)   Resp 20   Ht '5\' 3"'$  (1.6 m)   Wt 97.1 kg   SpO2 99%   BMI 37.91 kg/m   Physical Exam Vitals and nursing note reviewed.  Constitutional:      Appearance: Normal appearance.  HENT:     Head: Normocephalic and atraumatic.     Nose: Nose normal.     Mouth/Throat:     Mouth: Mucous membranes are moist.  Eyes:     Extraocular Movements: Extraocular movements intact.     Conjunctiva/sclera: Conjunctivae normal.  Cardiovascular:     Rate and Rhythm: Normal rate.  Pulmonary:     Effort: Pulmonary effort is normal.     Breath sounds: Normal breath sounds.  Abdominal:     General: Abdomen is flat.     Palpations: Abdomen is soft.     Tenderness: There is abdominal tenderness (mild diffuse). There is no guarding.  Genitourinary:    Comments: Chaperone present, small non thrombosed hemorrhoids, on DRE no masses, small amount of dark red stool on finger Musculoskeletal:        General: No swelling. Normal range of motion.     Cervical back: Neck supple.  Skin:    General: Skin is warm and dry.  Neurological:     General: No focal deficit present.     Mental Status: She is alert.  Psychiatric:        Mood and Affect: Mood normal.     ED Results / Procedures / Treatments   Labs (all labs ordered are listed, but only abnormal results are displayed) Labs Reviewed  CBC WITH DIFFERENTIAL/PLATELET - Abnormal; Notable for the following components:      Result Value   RBC 5.20 (*)    Hemoglobin 15.5 (*)    Platelets 412 (*)    All other components within normal limits  BASIC METABOLIC PANEL - Abnormal; Notable for the following components:   Sodium 134 (*)     Glucose, Bld 103 (*)    All other components within normal limits  OCCULT BLOOD X 1 CARD TO LAB, STOOL - Abnormal; Notable for the following components:   Fecal Occult Bld POSITIVE (*)    All other components within normal limits    EKG EKG Interpretation  Date/Time:  Sunday September 11 2021 09:27:57 EDT Ventricular  Rate:  64 PR Interval:  172 QRS Duration: 94 QT Interval:  404 QTC Calculation: 417 R Axis:   67 Text Interpretation: Sinus rhythm Normal ECG No significant change since Confirmed by Calvert Cantor [phone]) on 09/11/2021 9:34:42 AM   Radiology No results found.  Procedures Procedures  Medications Ordered in the ED Medications - No data to display   MDM Rules/Calculators/A&P MDM   ED Course  I have reviewed the triage vital signs and the nursing notes.  Pertinent labs & imaging results that were available during my care of the patient were reviewed by me and considered in my medical decision making (see chart for details).  Clinical Course as of 09/11/21 1124  Sun Sep 11, 2021  1012 CBC with normal Hgb.  [CS]  [id number] BMP is normal.  [CS]  [id number] Spoke with [facility] PA who will send a message to the clinic nurse to help expedite her follow up.  [CS]    Clinical Course User Index [CS] Truddie Hidden, MD    Final Clinical Impression(s) / ED Diagnoses Final diagnoses:  Rectal bleeding    Rx / DC Orders ED Discharge Orders          Ordered    dicyclomine (BENTYL) 20 MG tablet  2 times daily        09/11/21 1123             Truddie Hidden, MD 09/11/21 1124

## 2021-09-12 ENCOUNTER — Encounter: Payer: Self-pay | Admitting: Gastroenterology

## 2021-09-12 ENCOUNTER — Ambulatory Visit: Payer: 59 | Admitting: Physician Assistant

## 2021-09-13 ENCOUNTER — Ambulatory Visit (HOSPITAL_BASED_OUTPATIENT_CLINIC_OR_DEPARTMENT_OTHER): Payer: 59

## 2021-09-13 ENCOUNTER — Ambulatory Visit (INDEPENDENT_AMBULATORY_CARE_PROVIDER_SITE_OTHER): Payer: 59 | Admitting: Family

## 2021-09-13 ENCOUNTER — Telehealth: Payer: Self-pay | Admitting: Family

## 2021-09-13 ENCOUNTER — Ambulatory Visit (HOSPITAL_BASED_OUTPATIENT_CLINIC_OR_DEPARTMENT_OTHER)
Admission: RE | Admit: 2021-09-13 | Discharge: 2021-09-13 | Disposition: A | Discharge status: Home / Self Care | Payer: 59 | Source: Ambulatory Visit | Attending: Family | Admitting: Family

## 2021-09-13 ENCOUNTER — Encounter: Payer: Self-pay | Admitting: Family

## 2021-09-13 ENCOUNTER — Other Ambulatory Visit: Payer: Self-pay

## 2021-09-13 ENCOUNTER — Encounter (HOSPITAL_BASED_OUTPATIENT_CLINIC_OR_DEPARTMENT_OTHER): Payer: Self-pay

## 2021-09-13 VITALS — BP 108/83 | HR 90 | Temp 98.6°F | Resp 16 | Ht 63.0 in | Wt 213.0 lb

## 2021-09-13 DIAGNOSIS — R1084 Generalized abdominal pain: Secondary | ICD-10-CM | POA: Diagnosis not present

## 2021-09-13 DIAGNOSIS — E0590 Thyrotoxicosis, unspecified without thyrotoxic crisis or storm: Secondary | ICD-10-CM | POA: Diagnosis not present

## 2021-09-13 DIAGNOSIS — K921 Melena: Secondary | ICD-10-CM | POA: Diagnosis not present

## 2021-09-13 DIAGNOSIS — R079 Chest pain, unspecified: Secondary | ICD-10-CM | POA: Insufficient documentation

## 2021-09-13 DIAGNOSIS — F319 Bipolar disorder, unspecified: Secondary | ICD-10-CM

## 2021-09-13 DIAGNOSIS — R911 Solitary pulmonary nodule: Secondary | ICD-10-CM

## 2021-09-13 LAB — D-DIMER, QUANTITATIVE: D-Dimer, Quant: 0.47 mcg/mL FEU (ref <0.50)

## 2021-09-13 MED ORDER — IOHEXOL 350 MG/ML SOLN
100.0000 mL | Freq: Once | INTRAVENOUS | Status: AC | PRN
  Administered 2021-09-13: 100 mL via INTRAVENOUS

## 2021-09-13 NOTE — Patient Instructions (Addendum)
Please complete lab work prior to leaving. Please call psychiatry below to schedule an appointment with a psychiatrist for Med Management.   Psychiatric Services:  [facility] [place] [facility]) - [phone] Dr. Chucky May [facility]) [phone] [facility] [place]) [phone]) [phone]

## 2021-09-13 NOTE — Progress Notes (Deleted)
Subjective:     Patient ID: Wanda Mays, female    DOB: 1976-03-31, 45 y.o.   MRN: [id number]  Chief Complaint  Patient presents with   New Patient (Initial Visit)    Here to establish care, moved to [facility] from [facility] March 2021    HPI  Patient has several month history of Tachycardia, palpitations, chest pain, lightheadedness, heat intolerance and sweating.  Of note, she did have COVID19 back in June and most of her symptoms began after that time. She is accompanied by her mother from [facility] today who will soon be returning to [facility] with her two youngest children in order to give the patient a break.  She was seen in the ED intially and then by cardiology.  Cardiology work up revealed depressed TSH.  Cardiology started propranolol TID.    She was seen in the ED on 09/11/21 for rectal bleeding. H/H was normal and pt was referred to GI. She was also given rx for Bentyl 43m bid which she has not found helpful.  Bipolar 1 disorder- reports that has been hospitalized 2x in the past.  She has been on "too many medications" in the past and is not currently on medication.  Notes mood is labile, can be fine one minute and very depressed the next minute.    Health Maintenance Due  Topic Date Due   COVID-19 Vaccine (1) Never done   HIV Screening  Never done   Hepatitis C Screening  Never done   TETANUS/TDAP  Never done   PAP SMEAR-Modifier  Never done   INFLUENZA VACCINE  Never done    Past Medical History:  Diagnosis Date   ADD (attention deficit disorder)    Bipolar 1 disorder (HCC)    Endometriosis    GERD (gastroesophageal reflux disease)    Hypercholesteremia    Hypertension    OCD (obsessive compulsive disorder)     Past Surgical History:  Procedure Laterality Date   ABDOMINAL HYSTERECTOMY     CESAREAN SECTION     CHOLECYSTECTOMY     LAPAROSCOPIC ABDOMINAL EXPLORATION      Family History  Problem Relation Age of Onset   Clotting disorder Mother        TIA's   Diabetes type II  Mother    Hypertension Mother    Hypertension Father    Diabetes type II Father    Heart Problems Father    Hypertension Sister    Diabetes type II Sister    Heart murmur Brother    Asthma Brother    Atrial fibrillation Maternal Grandmother    Diabetes Mellitus I Paternal Grandfather    Heart Problems Paternal Grandfather    Lung cancer Paternal Grandfather    Asthma Daughter    ADD / ADHD Daughter    ADD / ADHD Son    Asthma Son     Social History   Socioeconomic History   Marital status: Married    Spouse name: CHarrell Gave  Number of children: Not on file   Years of education: Not on file   Highest education level: Not on file  Occupational History   Not on file  Tobacco Use   Smoking status: Every Day    Packs/day: 1.00    Years: 1.00    Pack years: 1.00    Types: Cigarettes    Passive exposure: Current   Smokeless tobacco: Never  Vaping Use   Vaping Use: Never used  Substance and Sexual Activity  Alcohol use: Not Currently   Drug use: Not Currently   Sexual activity: Not Currently  Other Topics Concern   Not on file  Social History Narrative   Patient is currently unemployed   CMA, has been doing customer service   Married   5 children   [place]   2006 [place] (son)      2 children local and 3 in [place]      Enjoys crafts, playing with kids, plays piano   1 cat and 1 dog   Completed associates degree   Social Determinants of Radio broadcast assistant Strain: Not on file  Food Insecurity: Not on file  Transportation Needs: Not on file  Physical Activity: Inactive   Days of Exercise per Week: 0 days   Minutes of Exercise per Session: 0 min  Stress: Not on file  Social Connections: Not on file  Intimate Partner Violence: Not on file    Outpatient Medications Prior to Visit  Medication Sig Dispense Refill   ADVAIR DISKUS 250-50 MCG/ACT AEPB Inhale 1 puff into the lungs 2 (two) times  daily.     albuterol (VENTOLIN HFA) 108 (90 Base) MCG/ACT inhaler Inhale 2 puffs into the lungs every 6 (six) hours as needed for wheezing or shortness of breath. 18 g 0   dicyclomine (BENTYL) 20 MG tablet Take 1 tablet (20 mg total) by mouth 2 (two) times daily. 20 tablet 0   diphenhydramine-acetaminophen (TYLENOL PM) 25-500 MG TABS tablet Take 1 tablet by mouth at bedtime as needed (sleep). 14 tablet    DULoxetine (CYMBALTA) 60 MG capsule Take 60 mg by mouth daily.     hydrochlorothiazide (HYDRODIURIL) 25 MG tablet Take 25 mg by mouth daily.     hydrOXYzine (ATARAX/VISTARIL) 25 MG tablet Take 1 tablet (25 mg total) by mouth 3 (three) times daily as needed for anxiety. 42 tablet 0   metoCLOPramide (REGLAN) 10 MG tablet Take 1 tablet (10 mg total) by mouth every 8 (eight) hours as needed for nausea. 20 tablet 0   omeprazole (PRILOSEC) 20 MG capsule Take 1 capsule (20 mg total) by mouth daily. 20 capsule 0   ondansetron (ZOFRAN) 4 MG tablet Take 1 tablet (4 mg total) by mouth every 8 (eight) hours as needed for nausea or vomiting. 15 tablet 0   potassium chloride (KLOR-CON) 10 MEQ tablet Take 10 mEq by mouth daily.     potassium chloride SA (KLOR-CON) 20 MEQ tablet Take 1 tablet (20 mEq total) by mouth daily. Take for 3 days. (Total of 20 mEq) 3 tablet 0   propranolol (INDERAL) 40 MG tablet Take 1 tablet (40 mg total) by mouth every 8 (eight) hours. 216 tablet 4   No facility-administered medications prior to visit.    Allergies  Allergen Reactions   Aspirin    Penicillins     ROS See HPI    Objective:    Physical Exam  BP 108/83 (BP Location: Right Arm, Patient Position: Sitting, Cuff Size: Large)   Pulse 90   Temp 98.6 F (37 C) (Oral)   Resp 16   Ht _0  (1.6 m)   Wt 213 lb (96.6 kg)   SpO2 97%   BMI 37.73 kg/m  Wt Readings from Last 3 Encounters:  09/15/21 213 lb (96.6 kg)  09/13/21 213 lb (96.6 kg)  09/11/21 214 lb (97.1 kg)       Assessment &  Plan:   Problem  List Items Addressed This Visit       Unprioritized   Lung nodule    12 mm, new referred to pulmonology.       Hyperthyroidism   Relevant Orders   TSH (Completed)   T3, free (Completed)   T4, free (Completed)   Comp Met (CMET) (Completed)   Hematochezia    New. Will refer to GI for further evaluation.       Relevant Orders   CT Abdomen Pelvis W Contrast (Completed)   CBC with Differential/Platelet (Completed)   Generalized abdominal pain - Primary    New. Obtained CT abd/pelvis which was negative. Did ultimately refer pt to GYN due to her hx of endometriosis.       Relevant Orders   CT Abdomen Pelvis W Contrast (Completed)   Chest pain    CTA is performed and is negative for PE.       Relevant Orders   CT Angio Chest W/Cm &/Or Wo Cm (Completed)   D-Dimer, Quantitative (Completed)   Bipolar 1 disorder (HCC)    Uncontrolled.  She is not actively suicidal at this time, but when she went to the [facility] walk-in ED, she was told that she "was not suicidal enough" to admit her.  I would like for her to establish with a psychiatrist and I have given her some numbers to call.  In addition, she is agreeable to call [phone] if she has suicidal thoughts with plan.         I am having [name] maintain her omeprazole, albuterol, metoCLOPramide, hydrOXYzine, diphenhydramine-acetaminophen, DULoxetine, potassium chloride, hydrochlorothiazide, Advair Diskus, propranolol, potassium chloride SA, ondansetron, and dicyclomine.  No orders of the defined types were placed in this encounter.

## 2021-09-13 NOTE — Telephone Encounter (Signed)
[facility] Radiology: Din: [phone] No def evidence of pulmonary embolism is noted

## 2021-09-13 NOTE — Progress Notes (Signed)
Subjective:   By signing my name below, I, [name], attest that this documentation has been prepared under the direction and in the presence of [name], NP, 09/13/2021   Patient ID: Wanda Mays, female    DOB: 06/26/1976, 45 y.o.   MRN: [id number]  Chief Complaint  Patient presents with   New Patient (Initial Visit)    Here to establish care, moved to [facility] from [facility] March 2021    HPI Patient is in today for an office visit to establish care. Her mother is present in the room with her today.  Covid-19: She was diagnosed with Covid-19 in the middle of June for the 3rd time. She has experienced a series of nausea, vomiting, diarrhea, SOB, and chest pain since her diagnosis.  Rectal Bleeding: She has started experiencing rectal bleeding that began 5 days. She describes it as someone pouring a cup of blood from her body. Stomach pain: She complains of severe stomach pain that keeps her awake throughout the night.  Back pain: She also complains of back pain associated with her stomach pain.  Nipple Discharge: She has recently noticed discharge coming from her left nipple and complains of left breast pain.  Thyroid: She has a history of an overactive thyroid. This is associated with hot and cold flashes, fatigue, appetite changes, and mood imbalance.  Sepsis: She was previously diagnosed with sepsis with no etiology.  Previous care: She has bounced around providers and facilities but would like to maintain in the [facility] realm from here on out.  Suicidal thoughts: She has been feeling suicidal and has acted out things such as giving away all of her things and deciding that it is all over. She has been seen in ED multiple times and has not felt like she was taken seriously.  Bipolar: She was being treated for her bipolar disorder and stopped taking her medication 2 years before moving to [place]. She has an appointment with [facility] but would rather have an  appointment with a psychiatrist so that they can help with medication.  Moved to [place]: She moved to [facility] in March of 2022 from [place].  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 She has recently lost her job. Family: She has 5 children, [place] born in [place], [place] born in [place], [place] born in [place], [place] born in 2006, [place] born in 2010 and she is married. Her children are going to live with her mother due to her current state of health. She will only be living with her husband.  Pets: She has a dog and car in her home. Education: Highest degree was an associates degree.  Medical History: She has been diagnosed with high blood pressure, tachycardia, anxiety, and bipolar. She was taking 60 mg cymbalta but has since quit.  FMHx: Father has diabetes, heart issues, and high blood pressure. Her mother has high blood pressure, diabetes, and a blood clotting disorder. She has previously been diagnosed with TIA.Brother has a heart murmur and asthma and sister has high blood pressure and diabetes. Her son, Ulice Bold and daughter, Glenard Haring, have ADHD and asthma.  Health Maintenance Due  Topic Date Due   COVID-19 Vaccine (1) Never done   HIV Screening  Never done   Hepatitis C Screening  Never done   TETANUS/TDAP  Never done   PAP SMEAR-Modifier  Never done   INFLUENZA VACCINE  Never done    Past Medical History:  Diagnosis Date   ADD (attention deficit disorder)    Bipolar 1 disorder (HCC)    Endometriosis    GERD (gastroesophageal reflux disease)    Hypercholesteremia    Hypertension    OCD (obsessive compulsive disorder)     Past Surgical History:  Procedure Laterality Date   ABDOMINAL HYSTERECTOMY     CESAREAN SECTION     CHOLECYSTECTOMY     LAPAROSCOPIC ABDOMINAL EXPLORATION      Family History  Problem Relation Age of Onset   Clotting disorder Mother        TIA's   Diabetes type II Mother    Hypertension Mother    Hypertension  Father    Diabetes type II Father    Heart Problems Father    Hypertension Sister    Diabetes type II Sister    Heart murmur Brother    Asthma Brother    Atrial fibrillation Maternal Grandmother    Diabetes Mellitus I Paternal Grandfather    Heart Problems Paternal Grandfather    Lung cancer Paternal Grandfather    Asthma Daughter    ADD / ADHD Daughter    ADD / ADHD Son    Asthma Son     Social History   Socioeconomic History   Marital status: Married    Spouse name: Harrell Gave   Number of children: Not on file   Years of education: Not on file   Highest education level: Not on file  Occupational History   Not on file  Tobacco Use   Smoking status: Every Day    Packs/day: 1.00    Years: 1.00    Pack years: 1.00    Types: Cigarettes    Passive exposure: Current   Smokeless tobacco: Never  Vaping Use   Vaping Use: Never used  Substance and Sexual Activity   Alcohol use: Not Currently   Drug use: Not Currently   Sexual activity: Not Currently  Other Topics Concern   Not on file  Social History Narrative   Patient is currently unemployed   CMA, has been doing customer service   Married   5 children   [place]   2006 [place] (son)      2 children local and 3 in [place]      Enjoys crafts, playing with kids, plays piano   1 cat and 1 dog   Completed associates degree   Social Determinants of Radio broadcast assistant Strain: Not on file  Food Insecurity: Not on file  Transportation Needs: Not on file  Physical Activity: Inactive   Days of Exercise per Week: 0 days   Minutes of Exercise per Session: 0 min  Stress: Not on file  Social Connections: Not on file  Intimate Partner Violence: Not on file    Outpatient Medications Prior to Visit  Medication Sig Dispense Refill   ADVAIR DISKUS 250-50 MCG/ACT AEPB Inhale 1 puff into the lungs 2 (two) times daily.     albuterol (VENTOLIN HFA) 108 (90 Base)  MCG/ACT inhaler Inhale 2 puffs into the lungs every 6 (six) hours as needed for wheezing or shortness of breath. 18 g 0   dicyclomine (BENTYL) 20 MG tablet Take 1 tablet (20 mg total) by mouth 2 (two) times daily. 20 tablet 0   diphenhydramine-acetaminophen (TYLENOL PM) 25-500 MG TABS tablet Take 1 tablet by mouth at bedtime as needed (sleep). 14 tablet  DULoxetine (CYMBALTA) 60 MG capsule Take 60 mg by mouth daily.     hydrochlorothiazide (HYDRODIURIL) 25 MG tablet Take 25 mg by mouth daily.     hydrOXYzine (ATARAX/VISTARIL) 25 MG tablet Take 1 tablet (25 mg total) by mouth 3 (three) times daily as needed for anxiety. 42 tablet 0   metoCLOPramide (REGLAN) 10 MG tablet Take 1 tablet (10 mg total) by mouth every 8 (eight) hours as needed for nausea. 20 tablet 0   omeprazole (PRILOSEC) 20 MG capsule Take 1 capsule (20 mg total) by mouth daily. 20 capsule 0   ondansetron (ZOFRAN) 4 MG tablet Take 1 tablet (4 mg total) by mouth every 8 (eight) hours as needed for nausea or vomiting. 15 tablet 0   potassium chloride (KLOR-CON) 10 MEQ tablet Take 10 mEq by mouth daily.     potassium chloride SA (KLOR-CON) 20 MEQ tablet Take 1 tablet (20 mEq total) by mouth daily. Take for 3 days. (Total of 20 mEq) 3 tablet 0   propranolol (INDERAL) 40 MG tablet Take 1 tablet (40 mg total) by mouth every 8 (eight) hours. 216 tablet 4   No facility-administered medications prior to visit.    Allergies  Allergen Reactions   Aspirin    Penicillins     Review of Systems  Constitutional:  Positive for chills, diaphoresis and malaise/fatigue.       (+) decreased appetite   Eyes:  Positive for blurred vision.  Respiratory:  Positive for cough and shortness of breath.   Cardiovascular:  Positive for palpitations (when walking around or talking for prolonged periods of time) and leg swelling (mild swelling primarily on right leg).  Gastrointestinal:  Positive for abdominal pain, diarrhea, nausea and vomiting.        (+) Rectal bleeding (+) difficulty swallowing (+) cramping  Musculoskeletal:  Positive for back pain, joint pain and myalgias.  Skin:        (+) discharge from nipples  Neurological:  Positive for dizziness and headaches.  Psychiatric/Behavioral:  Positive for depression, memory loss ("brain fog") and suicidal ideas. The patient is nervous/anxious and has insomnia.       Objective:    Physical Exam Constitutional:      General: She is not in acute distress.    Appearance: Normal appearance. She is not ill-appearing.  HENT:     Head: Normocephalic and atraumatic.     Right Ear: External ear normal.     Left Ear: External ear normal.  Eyes:     Extraocular Movements: Extraocular movements intact.     Pupils: Pupils are equal, round, and reactive to light.  Cardiovascular:     Rate and Rhythm: Normal rate and regular rhythm.     Heart sounds: Normal heart sounds. No murmur heard.   No gallop.  Pulmonary:     Effort: Pulmonary effort is normal. No respiratory distress.     Breath sounds: Normal breath sounds. No wheezing or rales.  Abdominal:     General: Bowel sounds are normal.     Tenderness: There is abdominal tenderness (generalized tenderness). There is no guarding.  Skin:    General: Skin is warm and dry.  Neurological:     Mental Status: She is alert and oriented to person, place, and time.  Psychiatric:        Behavior: Behavior normal.        Judgment: Judgment normal.     Comments: (+) suicidal ideation  (+) flat and tearful affect  BP 108/83 (BP Location: Right Arm, Patient Position: Sitting, Cuff Size: Large)   Pulse 90   Temp 98.6 F (37 C) (Oral)   Resp 16   Ht 5' 3"  (1.6 m)   Wt 213 lb (96.6 kg)   SpO2 97%   BMI 37.73 kg/m  Wt Readings from Last 3 Encounters:  09/15/21 213 lb (96.6 kg)  09/13/21 213 lb (96.6 kg)  09/11/21 214 lb (97.1 kg)       Assessment & Plan:   Problem List Items Addressed This Visit       Unprioritized   Lung  nodule    12 mm, new referred to pulmonology.       Hyperthyroidism   Relevant Orders   TSH (Completed)   T3, free (Completed)   T4, free (Completed)   Comp Met (CMET) (Completed)   Hematochezia    New. Will refer to GI for further evaluation.       Relevant Orders   CT Abdomen Pelvis W Contrast (Completed)   CBC with Differential/Platelet (Completed)   Generalized abdominal pain - Primary    New. Obtained CT abd/pelvis which was negative. Did ultimately refer pt to GYN due to her hx of endometriosis.       Relevant Orders   CT Abdomen Pelvis W Contrast (Completed)   Chest pain    CTA is performed and is negative for PE.       Relevant Orders   CT Angio Chest W/Cm &/Or Wo Cm (Completed)   D-Dimer, Quantitative (Completed)   Bipolar 1 disorder (HCC)    Uncontrolled despite cymbalta 59m She is not actively suicidal at this time, but [facility], she was told that she "was not suicidal enough" to admit her.  I would like for her to establish with a psychiatrist and I have given her some numbers to call.  In addition, she is agreeable to call 911 if she has suicidal thoughts with plan.         No orders of the defined types were placed in this encounter.  60 minutes spent on today's visit. The majority of this time was spent interviewing/counseling patient and reviewing outside records.   I, MDebbrah Alar NP, personally preformed the services described in this documentation.  All medical record entries made by the scribe were at my direction and in my presence.  I have reviewed the chart and discharge instructions (if applicable) and agree that the record reflects my personal performance and is accurate and complete. 09/13/2021  I,[name],acting as a sEducation administratorfor MNance Pear NP.,have documented all relevant documentation on the behalf of MNance Pear NP,as directed by  MNance Pear  NP while in the presence of MNance Pear NP.     MNance Pear NP

## 2021-09-14 ENCOUNTER — Telehealth: Payer: Self-pay | Admitting: Family

## 2021-09-14 ENCOUNTER — Ambulatory Visit (HOSPITAL_BASED_OUTPATIENT_CLINIC_OR_DEPARTMENT_OTHER)
Admission: RE | Admit: 2021-09-14 | Discharge: 2021-09-14 | Disposition: A | Discharge status: Home / Self Care | Payer: 59 | Source: Ambulatory Visit | Attending: Family | Admitting: Family

## 2021-09-14 DIAGNOSIS — R911 Solitary pulmonary nodule: Secondary | ICD-10-CM | POA: Insufficient documentation

## 2021-09-14 DIAGNOSIS — K921 Melena: Secondary | ICD-10-CM | POA: Diagnosis present

## 2021-09-14 DIAGNOSIS — R1084 Generalized abdominal pain: Secondary | ICD-10-CM | POA: Insufficient documentation

## 2021-09-14 LAB — T3, FREE: T3, Free: 3.3 pg/mL (ref 2.3–4.2)

## 2021-09-14 LAB — COMPREHENSIVE METABOLIC PANEL
ALT: 21 U/L (ref 0–35)
AST: 16 U/L (ref 0–37)
Albumin: 4.8 g/dL (ref 3.5–5.2)
Alkaline Phosphatase: 64 U/L (ref 39–117)
BUN: 13 mg/dL (ref 6–23)
CO2: 24 mEq/L (ref 19–32)
Calcium: 9.5 mg/dL (ref 8.4–10.5)
Chloride: 102 mEq/L (ref 96–112)
Creatinine, Ser: 0.76 mg/dL (ref 0.40–1.20)
GFR: 95.48 mL/min (ref >60.00)
Glucose, Bld: 80 mg/dL (ref 70–99)
Potassium: 4 mEq/L (ref 3.5–5.1)
Sodium: 135 mEq/L (ref 135–145)
Total Bilirubin: 0.3 mg/dL (ref 0.2–1.2)
Total Protein: 7.8 g/dL (ref 6.0–8.3)

## 2021-09-14 LAB — CBC WITH DIFFERENTIAL/PLATELET
Basophils Absolute: 0.1 10*3/uL (ref 0.0–0.1)
Basophils Relative: 1 % (ref 0.0–3.0)
Eosinophils Absolute: 0.1 10*3/uL (ref 0.0–0.7)
Eosinophils Relative: 1.4 % (ref 0.0–5.0)
HCT: 41.4 % (ref 36.0–46.0)
Hemoglobin: 14.2 g/dL (ref 12.0–15.0)
Lymphocytes Relative: 28.1 % (ref 12.0–46.0)
Lymphs Abs: 2.4 10*3/uL (ref 0.7–4.0)
MCHC: 34.2 g/dL (ref 30.0–36.0)
MCV: 86.1 fl (ref 78.0–100.0)
Monocytes Absolute: 0.6 10*3/uL (ref 0.1–1.0)
Monocytes Relative: 7.5 % (ref 3.0–12.0)
Neutro Abs: 5.3 10*3/uL (ref 1.4–7.7)
Neutrophils Relative %: 62 % (ref 43.0–77.0)
Platelets: 368 10*3/uL (ref 150.0–400.0)
RBC: 4.81 Mil/uL (ref 3.87–5.11)
RDW: 13.4 % (ref 11.5–15.5)
WBC: 8.6 10*3/uL (ref 4.0–10.5)

## 2021-09-14 LAB — T4, FREE: Free T4: 0.82 ng/dL (ref 0.60–1.60)

## 2021-09-14 LAB — TSH: TSH: 0.96 u[IU]/mL (ref 0.35–5.50)

## 2021-09-14 MED ORDER — IOHEXOL 350 MG/ML SOLN
100.0000 mL | Freq: Once | INTRAVENOUS | Status: AC | PRN
  Administered 2021-09-14: 85 mL via INTRAVENOUS

## 2021-09-14 NOTE — Telephone Encounter (Signed)
Please advise pt that CT was negative for blood clot which is good news.  They did make note of a nodule in her lungs.  I would like to refer her to pulmonology for further evaluation of the nodule.  Referral has been placed.

## 2021-09-14 NOTE — Telephone Encounter (Signed)
Pt was seen yesterday by Lenna Sciara and never mentioned it.

## 2021-09-14 NOTE — Telephone Encounter (Signed)
Patient advised of results, provider's comments and referral. She verbalized understanding.

## 2021-09-15 ENCOUNTER — Encounter: Payer: Self-pay | Admitting: Family

## 2021-09-15 ENCOUNTER — Emergency Department (HOSPITAL_BASED_OUTPATIENT_CLINIC_OR_DEPARTMENT_OTHER)
Admission: EM | Admit: 2021-09-15 | Discharge: 2021-09-15 | Disposition: A | Discharge status: Home / Self Care | Payer: 59 | Attending: Emergency Medicine | Admitting: Emergency Medicine

## 2021-09-15 ENCOUNTER — Other Ambulatory Visit (HOSPITAL_BASED_OUTPATIENT_CLINIC_OR_DEPARTMENT_OTHER): Payer: Self-pay

## 2021-09-15 ENCOUNTER — Other Ambulatory Visit: Payer: Self-pay

## 2021-09-15 ENCOUNTER — Encounter (HOSPITAL_BASED_OUTPATIENT_CLINIC_OR_DEPARTMENT_OTHER): Payer: Self-pay | Admitting: *Deleted

## 2021-09-15 DIAGNOSIS — R1030 Lower abdominal pain, unspecified: Secondary | ICD-10-CM | POA: Diagnosis not present

## 2021-09-15 DIAGNOSIS — I10 Essential (primary) hypertension: Secondary | ICD-10-CM | POA: Insufficient documentation

## 2021-09-15 DIAGNOSIS — F17210 Nicotine dependence, cigarettes, uncomplicated: Secondary | ICD-10-CM | POA: Insufficient documentation

## 2021-09-15 DIAGNOSIS — E0590 Thyrotoxicosis, unspecified without thyrotoxic crisis or storm: Secondary | ICD-10-CM | POA: Insufficient documentation

## 2021-09-15 DIAGNOSIS — R109 Unspecified abdominal pain: Secondary | ICD-10-CM | POA: Insufficient documentation

## 2021-09-15 DIAGNOSIS — N809 Endometriosis, unspecified: Secondary | ICD-10-CM

## 2021-09-15 DIAGNOSIS — K921 Melena: Secondary | ICD-10-CM | POA: Insufficient documentation

## 2021-09-15 DIAGNOSIS — R1084 Generalized abdominal pain: Secondary | ICD-10-CM | POA: Insufficient documentation

## 2021-09-15 DIAGNOSIS — R079 Chest pain, unspecified: Secondary | ICD-10-CM | POA: Insufficient documentation

## 2021-09-15 DIAGNOSIS — R319 Hematuria, unspecified: Secondary | ICD-10-CM | POA: Diagnosis not present

## 2021-09-15 DIAGNOSIS — Z79899 Other long term (current) drug therapy: Secondary | ICD-10-CM | POA: Diagnosis not present

## 2021-09-15 LAB — URINALYSIS, ROUTINE W REFLEX MICROSCOPIC
Bilirubin Urine: NEGATIVE
Glucose, UA: NEGATIVE mg/dL
Hgb urine dipstick: NEGATIVE
Ketones, ur: NEGATIVE mg/dL
Leukocytes,Ua: NEGATIVE
Nitrite: NEGATIVE
Protein, ur: NEGATIVE mg/dL
Specific Gravity, Urine: 1.015 (ref 1.005–1.030)
pH: 7.5 (ref 5.0–8.0)

## 2021-09-15 MED ORDER — OXYCODONE-ACETAMINOPHEN 5-325 MG PO TABS
1.0000 | ORAL_TABLET | Freq: Once | ORAL | Status: AC
Start: 2021-09-15 — End: 2021-09-15
  Administered 2021-09-15: 1 via ORAL
  Filled 2021-09-15: qty 1

## 2021-09-15 MED ORDER — OXYCODONE HCL 5 MG PO TABS
5.0000 mg | ORAL_TABLET | Freq: Four times a day (QID) | ORAL | 0 refills | Status: AC | PRN
  Filled 2021-09-15: qty 12, 3d supply, fill #0

## 2021-09-15 NOTE — Telephone Encounter (Signed)
Patient current status shows patient is currently in the ED

## 2021-09-15 NOTE — Assessment & Plan Note (Addendum)
Uncontrolled despite cymbalta 51m. She is not actively suicidal at this time, but when she went to the [facility] walk-in ED, she was told that she "was not suicidal enough" to admit her.  I would like for her to establish with a psychiatrist and I have given her some numbers to call.  In addition, she is agreeable to call 911 if she has suicidal thoughts with plan.

## 2021-09-15 NOTE — Assessment & Plan Note (Signed)
New. Will refer to GI for further evaluation.

## 2021-09-15 NOTE — Assessment & Plan Note (Signed)
CTA is performed and is negative for PE.

## 2021-09-15 NOTE — ED Triage Notes (Signed)
States she notified her primary care MD informing her PA that she has noted blood in her urine and has noted some rectal bleeding. Also been having some shortness of breath. Onset approx June 14th, 2022

## 2021-09-15 NOTE — ED Provider Notes (Signed)
[facility] EMERGENCY DEPARTMENT Provider Note   CSN: [id number] Arrival date & time: 09/15/21  0730     History Chief Complaint  Patient presents with   Hematuria    Bedie Dominey is a 45 y.o. female with history of endometriosis status post partial hysterectomy, reflux, obesity, hypertension, high cholesterol, present emergency department with concern for blood in urine.  The patient has been worked up in the ED recently for blood that she is having with her bowel movements.  She had a CT scan of the abdomen with contrast performed yesterday which showed no focal findings inside the abdomen, including no bladder lesions.  Prior to that earlier this month she had a CT PE scan done of the chest for shortness of breath which showed no acute PE but, did note an incidental pulmonary nodule, which she is aware.  She has a GI appointment scheduled for tomorrow.  Today she comes in the ED because she has noticed for the past 24 hours blood clots in her urine.  She continues have cramping pain across her abdomen which has been ongoing for many days.  It has not responded favorably to Bentyl.  She has also been using Motrin chronically at which she takes for degenerative disc disease, with little relief.  She denies any recent history of UTIs or any active dysuria.  She is not on blood thinners.  She has not seen a urologist.  She has never had this problem of hematuria before.  She reports he did call her PCPs office today and was told to come to the ER for evaluation.  HPI     Past Medical History:  Diagnosis Date   Bipolar 1 disorder ([place])    Endometriosis    GERD (gastroesophageal reflux disease)    Hypercholesteremia    Hypertension     Patient Active Problem List   Diagnosis Date Noted   Lung nodule 09/14/2021   Bipolar 1 disorder ([place]) 08/25/2021   GERD (gastroesophageal reflux disease) 08/25/2021   Hypercholesteremia 08/25/2021    Past Surgical History:  Procedure  Laterality Date   ABDOMINAL HYSTERECTOMY     CESAREAN SECTION     CHOLECYSTECTOMY     LAPAROSCOPIC ABDOMINAL EXPLORATION       OB History   No obstetric history on file.     Family History  Problem Relation Age of Onset   Clotting disorder Mother        TIA's   Diabetes type II Mother    Hypertension Mother    Hypertension Father    Diabetes type II Father    Heart Problems Father    Hypertension Sister    Diabetes type II Sister    Heart murmur Brother    Asthma Brother    Atrial fibrillation Maternal Grandmother    Diabetes Mellitus I Paternal Grandfather    Heart Problems Paternal Grandfather    Lung cancer Paternal Grandfather    Asthma Daughter    ADD / ADHD Daughter    ADD / ADHD Son    Asthma Son     Social History   Tobacco Use   Smoking status: Every Day    Packs/day: 1.00    Years: 1.00    Pack years: 1.00    Types: Cigarettes    Passive exposure: Current   Smokeless tobacco: Never  Vaping Use   Vaping Use: Never used  Substance Use Topics   Alcohol use: Not Currently   Drug use: Not Currently  Home Medications Prior to Admission medications   Medication Sig Start Date End Date Taking? Authorizing Provider  oxyCODONE (ROXICODONE) 5 MG immediate release tablet Take 1 tablet (5 mg total) by mouth every 6 (six) hours as needed for up to 12 days for severe pain. 09/15/21 09/27/21 Yes Jayzon Taras, Carola Rhine, MD  ADVAIR DISKUS 250-50 MCG/ACT AEPB Inhale 1 puff into the lungs 2 (two) times daily. 08/12/21   [provider]  albuterol (VENTOLIN HFA) 108 (90 Base) MCG/ACT inhaler Inhale 2 puffs into the lungs every 6 (six) hours as needed for wheezing or shortness of breath. 08/05/21   Hassell Done, Mary-Margaret, FNP  dicyclomine (BENTYL) 20 MG tablet Take 1 tablet (20 mg total) by mouth 2 (two) times daily. 09/11/21   Truddie Hidden, MD  diphenhydramine-acetaminophen (TYLENOL PM) 25-500 MG TABS tablet Take 1 tablet by mouth at bedtime as needed (sleep).  08/30/21   Revonda Humphrey, NP  DULoxetine (CYMBALTA) 60 MG capsule Take 60 mg by mouth daily. 08/20/21   [provider]  hydrochlorothiazide (HYDRODIURIL) 25 MG tablet Take 25 mg by mouth daily. 08/20/21   [provider]  hydrOXYzine (ATARAX/VISTARIL) 25 MG tablet Take 1 tablet (25 mg total) by mouth 3 (three) times daily as needed for anxiety. 08/30/21   Revonda Humphrey, NP  metoCLOPramide (REGLAN) 10 MG tablet Take 1 tablet (10 mg total) by mouth every 8 (eight) hours as needed for nausea. 08/23/21   Caccavale, Sophia, PA-C  omeprazole (PRILOSEC) 20 MG capsule Take 1 capsule (20 mg total) by mouth daily. 08/03/21   Blanchie Dessert, MD  ondansetron (ZOFRAN) 4 MG tablet Take 1 tablet (4 mg total) by mouth every 8 (eight) hours as needed for nausea or vomiting. 09/09/21   Perlie Mayo, NP  potassium chloride (KLOR-CON) 10 MEQ tablet Take 10 mEq by mouth daily. 08/26/21   [provider]  potassium chloride SA (KLOR-CON) 20 MEQ tablet Take 1 tablet (20 mEq total) by mouth daily. Take for 3 days. (Total of 20 mEq) 09/07/21 12/06/21  Tobb, Godfrey Pick, DO  propranolol (INDERAL) 40 MG tablet Take 1 tablet (40 mg total) by mouth every 8 (eight) hours. 09/02/21   Tobb, Kardie, DO    Allergies    Aspirin and Penicillins  Review of Systems   Review of Systems  Constitutional:  Negative for chills and fever.  Respiratory:  Negative for cough and shortness of breath.   Cardiovascular:  Negative for chest pain and palpitations.  Gastrointestinal:  Positive for abdominal distention and blood in stool. Negative for constipation, diarrhea and vomiting.  Genitourinary:  Positive for hematuria. Negative for difficulty urinating, dysuria and flank pain.  Musculoskeletal:  Negative for arthralgias and back pain.  Skin:  Negative for color change and rash.  Neurological:  Negative for syncope and headaches.  All other systems reviewed and are negative.  Physical Exam Updated Vital  Signs BP (!) 143/84 (BP Location: Right Arm)   Pulse 65   Temp 97.6 F (36.4 C) (Oral)   Resp 18   Ht 5\' 3"  (1.6 m)   Wt 96.6 kg   SpO2 98%   BMI 37.73 kg/m   Physical Exam Constitutional:      General: She is not in acute distress.    Appearance: She is obese.  HENT:     Head: Normocephalic and atraumatic.  Eyes:     Conjunctiva/sclera: Conjunctivae normal.     Pupils: Pupils are equal, round, and reactive to light.  Cardiovascular:  Rate and Rhythm: Normal rate and regular rhythm.  Pulmonary:     Effort: Pulmonary effort is normal. No respiratory distress.  Abdominal:     General: There is no distension.     Tenderness: There is abdominal tenderness in the suprapubic area. There is no guarding. Negative signs include Murphy's sign.  Skin:    General: Skin is warm and dry.  Neurological:     General: No focal deficit present.     Mental Status: She is alert. Mental status is at baseline.  Psychiatric:        Mood and Affect: Mood normal.        Behavior: Behavior normal.    ED Results / Procedures / Treatments   Labs (all labs ordered are listed, but only abnormal results are displayed) Labs Reviewed  URINALYSIS, ROUTINE W REFLEX MICROSCOPIC - Abnormal; Notable for the following components:      Result Value   APPearance HAZY (*)    All other components within normal limits    EKG None  Radiology CT Angio Chest W/Cm &/Or Wo Cm  Result Date: 09/13/2021 CLINICAL DATA:  Chest pain, shortness of breath. EXAM: CT ANGIOGRAPHY CHEST WITH CONTRAST TECHNIQUE: Multidetector CT imaging of the chest was performed using the standard protocol during bolus administration of intravenous contrast. Multiplanar CT image reconstructions and MIPs were obtained to evaluate the vascular anatomy. CONTRAST:  140mL OMNIPAQUE IOHEXOL 350 MG/ML SOLN COMPARISON:  None. FINDINGS: Cardiovascular: Satisfactory opacification of the pulmonary arteries to the segmental level. No evidence of  pulmonary embolism. Normal heart size. No pericardial effusion. Mediastinum/Nodes: No enlarged mediastinal, hilar, or axillary lymph nodes. Thyroid gland, trachea, and esophagus demonstrate no significant findings. Lungs/Pleura: No pneumothorax or pleural effusion is noted. Right lung is clear. 12 mm subpleural nodule is seen in the left posterior costophrenic sulcus. Upper Abdomen: No acute abnormality. Musculoskeletal: No chest wall abnormality. No acute or significant osseous findings. Review of the MIP images confirms the above findings. IMPRESSION: No definite evidence of pulmonary embolus is noted. 12 mm subpleural nodule is seen in the left posterior costophrenic sulcus. Consider one of the following in 3 months for both low-risk and high-risk individuals: (a) repeat chest CT, (b) follow-up PET-CT, or (c) tissue sampling. This recommendation follows the consensus statement: Guidelines for Management of Incidental Pulmonary Nodules Detected on CT Images: From the Fleischner Society 2017; Radiology 2017; 284:228-243. These results will be called to the ordering clinician or representative by the Radiologist Assistant, and communication documented in the PACS or zVision Dashboard. Electronically Signed   By: Marijo Conception M.D.   On: 09/13/2021 15:21   CT Abdomen Pelvis W Contrast  Result Date: 09/14/2021 CLINICAL DATA:  Acute abdominal pain and hematochezia EXAM: CT ABDOMEN AND PELVIS WITH CONTRAST TECHNIQUE: Multidetector CT imaging of the abdomen and pelvis was performed using the standard protocol following bolus administration of intravenous contrast. CONTRAST:  57mL OMNIPAQUE IOHEXOL 350 MG/ML SOLN COMPARISON:  None. FINDINGS: Lower chest: No acute abnormality. Hepatobiliary: No focal liver abnormality is seen. Status post cholecystectomy. No biliary dilatation. Pancreas: Unremarkable. No pancreatic ductal dilatation or surrounding inflammatory changes. Spleen: Normal in size without focal  abnormality. Adrenals/Urinary Tract: Adrenal glands are unremarkable. Kidneys are normal, without renal calculi, focal lesion, or hydronephrosis. Bladder is unremarkable. Stomach/Bowel: Stomach is within normal limits. Appendix appears normal. No evidence of bowel wall thickening, distention, or inflammatory changes. Vascular/Lymphatic: Intact aorta. Negative for aneurysm, dissection or occlusive process. Mesenteric and renal vasculature all appear patent.  Minor pelvic iliac atherosclerosis. No veno-occlusive process. No adenopathy. Reproductive: Status post hysterectomy. No adnexal masses. Other: Small midline fat containing umbilical hernia.  No ascites. Musculoskeletal: No acute or significant osseous findings. IMPRESSION: No acute intra-abdominopelvic finding by CT. Remote cholecystectomy and hysterectomy. Normal appendix Small fat containing umbilical hernia. Electronically Signed   By: Jerilynn Mages.  Shick M.D.   On: 09/14/2021 14:53    Procedures Procedures   Medications Ordered in ED Medications  oxyCODONE-acetaminophen (PERCOCET/ROXICET) 5-325 MG per tablet 1 tablet (1 tablet Oral Given 09/15/21 0845)    ED Course  I have reviewed the triage vital signs and the nursing notes.  Pertinent labs & imaging results that were available during my care of the patient were reviewed by me and considered in my medical decision making (see chart for details).  Differential diagnosis for hematuria includes UTI or infection versus kidney stone versus bladder carcinoma or mass versus other.  I personally reviewed the patient's recent medical work-up, including her blood test from 2 days ago, which showed a normal hemoglobin and normal kidney function.  Her prior blood test have shown a very steady and stable hemoglobin over the past month, despite these issues of ongoing bleeding.  She has no thrombocytopenia or other findings consistent with acute bleeding disorder.  I do not believe she needs repeat blood test  today, given her tests were performed 48 hours ago.  She is hemodynamically stable does not show signs of significant blood loss or anemia.  He is not sure signs or symptoms of sirs or sepsis.  We will check a UA to rule out an infection.  Her CT scan of the abdomen yesterday showed no evidence of kidney stones.  This presentation does not seem clinically consistent with kidney stone.  Her abdominal pain has been ongoing for many days or weeks, likely not related to a kidney stone.  It may be related to GI cramping, endometriosis, or other active GI issue, for which she is seeing a gastroenterologist tomorrow.  Clinical Course as of 09/15/21 1153  Thu Sep 15, 2021  0913 UA without evidence of blood or infection.  Discussed with the patient.  She is adamant that she did have blood clots in the urine.  I will offer [facility] phone number to follow-up for this, she may need a cystoscopy if this is persistent.  She already has a GI appointment set up for her chronic GI bleed issue.  We can prescribe some pain medications for this abdominal pain.  Otherwise she is okay with discharge. [MT]    Clinical Course User Index [MT] Wyvonnia Dusky, MD    Final Clinical Impression(s) / ED Diagnoses Final diagnoses:  Hematuria, unspecified type  Abdominal pain, unspecified abdominal location    Rx / DC Orders ED Discharge Orders          Ordered    oxyCODONE (ROXICODONE) 5 MG immediate release tablet  Every 6 hours PRN        09/15/21 0916             Wyvonnia Dusky, MD 09/15/21 1153

## 2021-09-15 NOTE — Assessment & Plan Note (Signed)
New. Obtained CT abd/pelvis which was negative. Did ultimately refer pt to GYN due to her hx of endometriosis.

## 2021-09-15 NOTE — Assessment & Plan Note (Signed)
12 mm, new referred to pulmonology.

## 2021-09-15 NOTE — ED Notes (Signed)
ED Provider at bedside. 

## 2021-09-16 ENCOUNTER — Ambulatory Visit (INDEPENDENT_AMBULATORY_CARE_PROVIDER_SITE_OTHER): Payer: 59 | Admitting: Gastroenterology

## 2021-09-16 ENCOUNTER — Encounter: Payer: Self-pay | Admitting: Gastroenterology

## 2021-09-16 ENCOUNTER — Ambulatory Visit (HOSPITAL_BASED_OUTPATIENT_CLINIC_OR_DEPARTMENT_OTHER)
Admission: RE | Admit: 2021-09-16 | Discharge: 2021-09-16 | Disposition: A | Discharge status: Home / Self Care | Payer: 59 | Source: Ambulatory Visit | Attending: Cardiology | Admitting: Cardiology

## 2021-09-16 ENCOUNTER — Other Ambulatory Visit (INDEPENDENT_AMBULATORY_CARE_PROVIDER_SITE_OTHER): Payer: 59

## 2021-09-16 VITALS — BP 118/70 | HR 75 | Ht 63.0 in | Wt 218.4 lb

## 2021-09-16 DIAGNOSIS — F319 Bipolar disorder, unspecified: Secondary | ICD-10-CM

## 2021-09-16 DIAGNOSIS — R112 Nausea with vomiting, unspecified: Secondary | ICD-10-CM

## 2021-09-16 DIAGNOSIS — K921 Melena: Secondary | ICD-10-CM

## 2021-09-16 DIAGNOSIS — R197 Diarrhea, unspecified: Secondary | ICD-10-CM

## 2021-09-16 DIAGNOSIS — F411 Generalized anxiety disorder: Secondary | ICD-10-CM

## 2021-09-16 DIAGNOSIS — R0602 Shortness of breath: Secondary | ICD-10-CM

## 2021-09-16 DIAGNOSIS — R1084 Generalized abdominal pain: Secondary | ICD-10-CM

## 2021-09-16 DIAGNOSIS — N809 Endometriosis, unspecified: Secondary | ICD-10-CM

## 2021-09-16 LAB — ECHOCARDIOGRAM COMPLETE
AR max vel: 2.68 cm2
AV Area VTI: 2.55 cm2
AV Area mean vel: 2.52 cm2
AV Mean grad: 4 mmHg
AV Peak grad: 7.1 mmHg
Ao pk vel: 1.33 m/s
Area-P 1/2: 4.24 cm2
Calc EF: 66 %
S' Lateral: 3.5 cm
Single Plane A2C EF: 65.3 %
Single Plane A4C EF: 66.9 %

## 2021-09-16 MED ORDER — PERFLUTREN LIPID MICROSPHERE
1.0000 mL | INTRAVENOUS | Status: AC | PRN
  Administered 2021-09-16: 2 mL via INTRAVENOUS

## 2021-09-16 MED ORDER — HYOSCYAMINE SULFATE 0.125 MG SL SUBL: 0.1250 mg | SUBLINGUAL_TABLET | Freq: Four times a day (QID) | SUBLINGUAL | 3 refills | Status: DC | PRN

## 2021-09-16 NOTE — Progress Notes (Signed)
Chief Complaint: Hematochezia, abdominal pain, nausea/vomiting  Referring Provider:     Toniann Ket, MD   HPI:     Wanda Mays is a 45 y.o. female with a history of bipolar, GERD, HTN, HLD, endometriosis, ADD, OCD, obesity (BMI 38.7), hysterectomy, cholecystectomy, ex lap, referred to the Gastroenterology Clinic for evaluation of n/v, diarrhea, hematochezia, abd pain.  Nausea/vomiting started in June when diagnosed with Covid (for the 3rd time) and has not improved since then. Prior to June, was not taking any medications.   Has since developed abdominal cramping, loose stools/diarrhea, and marroon colored stool over the last week or so. No prior similar sxs.   Was seen in the ER on 09/11/2021 for rectal bleeding x4 days and abdominal pain.  Diagnosed with nonthrombosed hemorrhoids.  FOBT positive stool, otherwise normal CBC, CMP.  Discharged with Bentyl and GI referral.  Was seen in the ER again yesterday with hematuria and continued abdominal cramping without response to Bentyl.  Chart extensively reviewed.  Patient has been seen in the ER 7 times in the last month for various complaints.  Work-up notable for the following: - 08/03/2021, 08/14/2021, 09/09/2019: Normal CXR - 08/03/2021: WBC 16.3, otherwise normal CBC, BMP, troponin, lipase, liver enzymes - 09/11/2021: FOBT positive.  WBC 9.7, normal H/H, PLT - 09/13/2021: CT Angio: 12 mm subpleural nodule, otherwise no PE - 09/13/2021: TSH 0.96, normal T4, T3, CBC, CMP - 09/14/2021: CT abdomen/pelvis: Normal GI tract, liver, pancreas, spleen  Recently seen in the Cardiology Clinic for palpitations, started on propranolol and evaluated with Zio monitor. Has a referral in for Gyn to evaluate hx of Endometriosis and to Urology for eval of clots in urine.   Reports EGD about 15 years ago in [place], [place] n/f [facility] per patient. Diagnosed with GERD and GB disease, treated with ccy.   Past Medical History:  Diagnosis Date  . ADD  (attention deficit disorder)   . Bipolar 1 disorder ([place])   . Endometriosis   . GERD (gastroesophageal reflux disease)   . Hiatal hernia   . Hypercholesteremia   . Hypertension   . Lung nodule seen on imaging study   . OCD (obsessive compulsive disorder)      Past Surgical History:  Procedure Laterality Date  . ABDOMINAL HYSTERECTOMY    . CESAREAN SECTION    . CHOLECYSTECTOMY    . COLONOSCOPY     around 2007 Frankey Poot did have polyps  . LAPAROSCOPIC ABDOMINAL EXPLORATION     Family History  Problem Relation Age of Onset  . Clotting disorder Mother        Margretta Sidle  . Diabetes type II Mother   . Hypertension Mother   . Hypertension Father   . Diabetes type II Father   . Heart Problems Father   . Hypertension Sister   . Diabetes type II Sister   . Heart murmur Brother   . Asthma Brother   . Atrial fibrillation Maternal Grandmother   . Diabetes Mellitus I Paternal Grandfather   . Heart Problems Paternal Grandfather   . Lung cancer Paternal Grandfather   . Asthma Daughter   . ADD / ADHD Daughter   . ADD / ADHD Son   . Asthma Son    Social History   Tobacco Use  . Smoking status: Every Day    Packs/day: 1.00    Years: 1.00    Pack years: 1.00    Types:  Cigarettes    Passive exposure: Current  . Smokeless tobacco: Never  Vaping Use  . Vaping Use: Never used  Substance Use Topics  . Alcohol use: Not Currently  . Drug use: Not Currently   Current Outpatient Medications  Medication Sig Dispense Refill  . ADVAIR DISKUS 250-50 MCG/ACT AEPB Inhale 1 puff into the lungs 2 (two) times daily.    Marland Kitchen albuterol (VENTOLIN HFA) 108 (90 Base) MCG/ACT inhaler Inhale 2 puffs into the lungs every 6 (six) hours as needed for wheezing or shortness of breath. 18 g 0  . dicyclomine (BENTYL) 20 MG tablet Take 1 tablet (20 mg total) by mouth 2 (two) times daily. 20 tablet 0  . diphenhydramine-acetaminophen (TYLENOL PM) 25-500 MG TABS tablet Take 1 tablet by mouth at bedtime as  needed (sleep). 14 tablet   . DULoxetine (CYMBALTA) 60 MG capsule Take 60 mg by mouth daily.    . hydrochlorothiazide (HYDRODIURIL) 25 MG tablet Take 25 mg by mouth daily.    . hydrOXYzine (ATARAX/VISTARIL) 25 MG tablet Take 1 tablet (25 mg total) by mouth 3 (three) times daily as needed for anxiety. 42 tablet 0  . metoCLOPramide (REGLAN) 10 MG tablet Take 1 tablet (10 mg total) by mouth every 8 (eight) hours as needed for nausea. 20 tablet 0  . omeprazole (PRILOSEC) 20 MG capsule Take 1 capsule (20 mg total) by mouth daily. 20 capsule 0  . ondansetron (ZOFRAN) 4 MG tablet Take 1 tablet (4 mg total) by mouth every 8 (eight) hours as needed for nausea or vomiting. 15 tablet 0  . oxyCODONE (ROXICODONE) 5 MG immediate release tablet Take 1 tablet (5 mg total) by mouth every 6 (six) hours as needed for up to 12 days for severe pain. 12 tablet 0  . potassium chloride (KLOR-CON) 10 MEQ tablet Take 10 mEq by mouth daily.    . potassium chloride SA (KLOR-CON) 20 MEQ tablet Take 1 tablet (20 mEq total) by mouth daily. Take for 3 days. (Total of 20 mEq) 3 tablet 0  . propranolol (INDERAL) 40 MG tablet Take 1 tablet (40 mg total) by mouth every 8 (eight) hours. 216 tablet 4   No current facility-administered medications for this visit.   Allergies  Allergen Reactions  . Aspirin   . Penicillins      Review of Systems: All systems reviewed and negative except where noted in HPI.     Physical Exam:    Wt Readings from Last 3 Encounters:  09/16/21 218 lb 6 oz (99.1 kg)  09/15/21 213 lb (96.6 kg)  09/13/21 213 lb (96.6 kg)    BP 118/70   Pulse 75   Ht 5' 3"  (1.6 m)   Wt 218 lb 6 oz (99.1 kg)   BMI 38.68 kg/m  Constitutional:  Pleasant, in no acute distress. Psychiatric: Normal mood and affect. Behavior is normal. Neck supple. No cervical LAD. Cardiovascular: Normal rate, regular rhythm. No edema Pulmonary/chest: Effort normal and breath sounds normal. No wheezing, rales or  rhonchi. Abdominal: Minimal generalized TTP without rebound or guarding. No peritoneal signs.  Soft, nondistended. Bowel sounds active throughout. There are no masses palpable. No hepatomegaly. Neurological: Alert and oriented to person place and time. Skin: Skin is warm and dry. No rashes noted.   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 Risks include but are not limited to bleeding, perforation, adverse reaction to medications, and cardiopulmonary compromise. Sequelae include but are not limited to the possibility of surgery, hospitalization, and mortality. The patient verbalized understanding and wished to proceed. All questions answered, referred to scheduler and bowel prep ordered. Further recommendations pending results of the exam.      Lavena Bullion, DO, FACG  09/16/2021, 2:19 PM   Thakor, Enis Gash, MD

## 2021-09-16 NOTE — Patient Instructions (Addendum)
If you are age 44 or older, your body mass index should be between 23-30. Your Body mass index is 38.68 kg/m. If this is out of the aforementioned range listed, please consider follow up with your Primary Care Provider.  If you are age 7 or younger, your body mass index should be between 19-25. Your Body mass index is 38.68 kg/m. If this is out of the aformentioned range listed, please consider follow up with your Primary Care Provider.   __________________________________________________________  The [facility] providers would like to encourage you to use [facility] to communicate with providers for non-urgent requests or questions.  Due to long hold times on the telephone, sending your provider a message by [facility] may be a faster and more efficient way to get a response.  Please allow 48 business hours for a response.  Please remember that this is for non-urgent requests.   Due to recent changes in healthcare laws, you may see the results of your imaging and laboratory studies on MyChart before your provider has had a chance to review them.  We understand that in some cases there may be results that are confusing or concerning to you. Not all laboratory results come back in the same time frame and the provider may be waiting for multiple results in order to interpret others.  Please give [place] 48 hours in order for your provider to thoroughly review all the results before contacting the office for clarification of your results.   Please go to the lab on the 2nd floor [address] before you leave the office today.   We have sent the following medications to your pharmacy for you to pick up at your convenience:  Levsin  Thank you for choosing me and [facility].  Vito Cirigliano, D.O.

## 2021-09-17 ENCOUNTER — Telehealth: Payer: 59 | Admitting: Nurse Practitioner

## 2021-09-17 DIAGNOSIS — U099 Post covid-19 condition, unspecified: Secondary | ICD-10-CM

## 2021-09-17 LAB — C-REACTIVE PROTEIN: CRP: 4 mg/L (ref 0–10)

## 2021-09-17 LAB — SEDIMENTATION RATE: Sed Rate: 6 mm/hr (ref 0–32)

## 2021-09-17 MED ORDER — IPRATROPIUM-ALBUTEROL 0.5-2.5 (3) MG/3ML IN SOLN: 3.0000 mL | RESPIRATORY_TRACT | 1 refills | Status: AC | PRN

## 2021-09-17 NOTE — Progress Notes (Signed)
Virtual Visit Consent   Wanda Mays, you are scheduled for a virtual visit with Wanda Mays, [place], a [facility] provider, today.     Just as with appointments in the office, your consent must be obtained to participate.  Your consent will be active for this visit and any virtual visit you may have with one of our providers in the next 365 days.     If you have a MyChart account, a copy of this consent can be sent to you electronically.  All virtual visits are billed to your insurance company just like a traditional visit in the office.    As this is a virtual visit, video technology does not allow for your provider to perform a traditional examination.  This may limit your provider's ability to fully assess your condition.  If your provider identifies any concerns that need to be evaluated in person or the need to arrange testing (such as labs, EKG, etc.), we will make arrangements to do so.     Although advances in technology are sophisticated, we cannot ensure that it will always work on either your end or our end.  If the connection with a video visit is poor, the visit may have to be switched to a telephone visit.  With either a video or telephone visit, we are not always able to ensure that we have a secure connection.     I need to obtain your verbal consent now.   Are you willing to proceed with your visit today? YES   [name] has provided verbal consent on 09/17/2021 for a virtual visit (video or telephone).   Wanda Hassell Done, FNP   Date: 09/17/2021 10:16 AM   Virtual Visit via Video Note   I, Wanda Mays, connected with Wanda Mays ([id number], 11/20/1976) on 09/17/21 at 10:15 AM EDT by a video-enabled telemedicine application and verified that I am speaking with the correct person using two identifiers.  Location: Patient: Virtual Visit Location Patient: Home Provider: Virtual Visit Location Provider: Mobile   I discussed the limitations of  evaluation and management by telemedicine and the availability of in person appointments. The patient expressed understanding and agreed to proceed.    History of Present Illness: Wanda Mays is a 45 y.o. who identifies as a female who was assigned female at birth, and is being seen today for long term covid.  HPI: Patient has been dx with long term covid on 08/03/21. She had covid in June 2022 and she has lots continued symptoms which has taken her to the ED on 7 occasional. Today she is SOB. Walking to her kitchen causes SOB. She has constant cough.  She has used her albuterol inhaler and it has not helped. She also has advair inhaler that she has already used.    Problems:  Patient Active Problem List   Diagnosis Date Noted   Hematochezia 09/15/2021   Generalized abdominal pain 09/15/2021   Hyperthyroidism 09/15/2021   Chest pain 09/15/2021   Lung nodule 09/14/2021   Bipolar 1 disorder ([place]) 08/25/2021   GERD (gastroesophageal reflux disease) 08/25/2021   Hypercholesteremia 08/25/2021    Allergies:  Allergies  Allergen Reactions   Aspirin    Penicillins    Medications:  Current Outpatient Medications:    ADVAIR DISKUS 250-50 MCG/ACT AEPB, Inhale 1 puff into the lungs 2 (two) times daily., Disp: , Rfl:    albuterol (VENTOLIN HFA) 108 (90 Base) MCG/ACT inhaler, Inhale 2 puffs into the lungs every 6 (six)  hours as needed for wheezing or shortness of breath., Disp: 18 g, Rfl: 0   dicyclomine (BENTYL) 20 MG tablet, Take 1 tablet (20 mg total) by mouth 2 (two) times daily., Disp: 20 tablet, Rfl: 0   diphenhydramine-acetaminophen (TYLENOL PM) 25-500 MG TABS tablet, Take 1 tablet by mouth at bedtime as needed (sleep)., Disp: 14 tablet, Rfl:    DULoxetine (CYMBALTA) 60 MG capsule, Take 60 mg by mouth daily., Disp: , Rfl:    hydrochlorothiazide (HYDRODIURIL) 25 MG tablet, Take 25 mg by mouth daily., Disp: , Rfl:    hydrOXYzine (ATARAX/VISTARIL) 25 MG tablet, Take 1 tablet (25 mg total) by  mouth 3 (three) times daily as needed for anxiety., Disp: 42 tablet, Rfl: 0   hyoscyamine (LEVSIN SL) 0.125 MG SL tablet, Place 1 tablet (0.125 mg total) under the tongue every 6 (six) hours as needed for cramping., Disp: 30 tablet, Rfl: 3   metoCLOPramide (REGLAN) 10 MG tablet, Take 1 tablet (10 mg total) by mouth every 8 (eight) hours as needed for nausea., Disp: 20 tablet, Rfl: 0   omeprazole (PRILOSEC) 20 MG capsule, Take 1 capsule (20 mg total) by mouth daily., Disp: 20 capsule, Rfl: 0   ondansetron (ZOFRAN) 4 MG tablet, Take 1 tablet (4 mg total) by mouth every 8 (eight) hours as needed for nausea or vomiting., Disp: 15 tablet, Rfl: 0   oxyCODONE (ROXICODONE) 5 MG immediate release tablet, Take 1 tablet (5 mg total) by mouth every 6 (six) hours as needed for up to 12 days for severe pain., Disp: 12 tablet, Rfl: 0   potassium chloride (KLOR-CON) 10 MEQ tablet, Take 10 mEq by mouth daily., Disp: , Rfl:    potassium chloride SA (KLOR-CON) 20 MEQ tablet, Take 1 tablet (20 mEq total) by mouth daily. Take for 3 days. (Total of 20 mEq), Disp: 3 tablet, Rfl: 0   propranolol (INDERAL) 40 MG tablet, Take 1 tablet (40 mg total) by mouth every 8 (eight) hours., Disp: 216 tablet, Rfl: 4  Observations/Objective: Patient is well-developed, well-nourished in no acute distress.  Resting comfortably  at home.  Head is normocephalic, atraumatic.  No labored breathing.  Speech is clear and coherent with logical content.  Patient is alert and oriented at baseline.    Assessment and Plan:  [name] in today with chief complaint of No chief complaint on file.   1. Long COVID Rest Side effects of med discussed - For home use only DME Nebulizer machine Meds ordered this encounter  Medications   ipratropium-albuterol (DUONEB) 0.5-2.5 (3) MG/3ML SOLN    Sig: Take 3 mLs by nebulization every 4 (four) hours as needed.    Dispense:  72 mL    Refill:  1    Order Specific Question:   Supervising Provider     Answer:   Noemi Chapel [[id number]]      Follow Up Instructions: I discussed the assessment and treatment plan with the patient. The patient was provided an opportunity to ask questions and all were answered. The patient agreed with the plan and demonstrated an understanding of the instructions.  A copy of instructions were sent to the patient via MyChart.  The patient was advised to call back or seek an in-person evaluation if the symptoms worsen or if the condition fails to improve as anticipated.  Time:  I spent 10 minutes with the patient via telehealth technology discussing the above problems/concerns.    Wanda Hassell Done, FNP

## 2021-09-19 ENCOUNTER — Encounter: Payer: Self-pay | Admitting: Family

## 2021-09-19 DIAGNOSIS — M545 Low back pain, unspecified: Secondary | ICD-10-CM

## 2021-09-19 DIAGNOSIS — G8929 Other chronic pain: Secondary | ICD-10-CM

## 2021-09-20 ENCOUNTER — Telehealth: Payer: Self-pay | Admitting: Family

## 2021-09-20 NOTE — Telephone Encounter (Signed)
Wanda Mays from [facility] is calling to get the office notes from the Office visit the patient had when [facility] sent the referral. She would also like Imaging of her lower back sent over to them. Wanda Mays states she got sent an X-ray of patient's chest but needs the images of pt's lower back. Everything can be faxed to [phone]. Patient has appointment with them on Oct, 25th.

## 2021-09-22 ENCOUNTER — Encounter: Payer: Self-pay | Admitting: Family

## 2021-09-22 ENCOUNTER — Other Ambulatory Visit: Payer: Self-pay | Admitting: Family Medicine

## 2021-09-22 DIAGNOSIS — R112 Nausea with vomiting, unspecified: Secondary | ICD-10-CM

## 2021-09-22 MED ORDER — ONDANSETRON HCL 4 MG PO TABS: 4.0000 mg | ORAL_TABLET | Freq: Three times a day (TID) | ORAL | 0 refills | Status: DC | PRN

## 2021-09-23 ENCOUNTER — Ambulatory Visit: Payer: 59 | Admitting: Psychologist

## 2021-09-26 ENCOUNTER — Other Ambulatory Visit: Payer: Self-pay

## 2021-09-26 ENCOUNTER — Other Ambulatory Visit: Payer: 59

## 2021-09-26 ENCOUNTER — Encounter: Payer: Self-pay | Admitting: Gastroenterology

## 2021-09-26 ENCOUNTER — Ambulatory Visit (AMBULATORY_SURGERY_CENTER): Payer: 59 | Admitting: Gastroenterology

## 2021-09-26 VITALS — BP 134/71 | HR 68 | Temp 98.0°F | Resp 16 | Ht 63.0 in | Wt 218.0 lb

## 2021-09-26 DIAGNOSIS — K921 Melena: Secondary | ICD-10-CM | POA: Diagnosis not present

## 2021-09-26 DIAGNOSIS — R1084 Generalized abdominal pain: Secondary | ICD-10-CM

## 2021-09-26 DIAGNOSIS — K2100 Gastro-esophageal reflux disease with esophagitis, without bleeding: Secondary | ICD-10-CM

## 2021-09-26 DIAGNOSIS — R112 Nausea with vomiting, unspecified: Secondary | ICD-10-CM

## 2021-09-26 DIAGNOSIS — K641 Second degree hemorrhoids: Secondary | ICD-10-CM

## 2021-09-26 DIAGNOSIS — R197 Diarrhea, unspecified: Secondary | ICD-10-CM

## 2021-09-26 MED ORDER — SODIUM CHLORIDE 0.9 % IV SOLN: 500.0000 mL | Freq: Once | INTRAVENOUS | Status: DC

## 2021-09-26 MED ORDER — OMEPRAZOLE 40 MG PO CPDR: 40.0000 mg | DELAYED_RELEASE_CAPSULE | Freq: Two times a day (BID) | ORAL | 3 refills | Status: DC

## 2021-09-26 NOTE — Op Note (Signed)
[facility] Patient Name: Wanda Mays Procedure Date: 09/26/2021 9:04 AM MRN: [id number] Endoscopist: Gerrit Heck , MD Age: 45 Referring MD:  Date of Birth: [birth year] Gender: Female Account #: [id number] Procedure:                Colonoscopy Indications:              Generalized abdominal pain, Hematochezia, Diarrhea Medicines:                Monitored Anesthesia Care Procedure:                Pre-Anesthesia Assessment:                           - Prior to the procedure, a History and Physical                            was performed, and patient medications and                            allergies were reviewed. The patient's tolerance of                            previous anesthesia was also reviewed. The risks                            and benefits of the procedure and the sedation                            options and risks were discussed with the patient.                            All questions were answered, and informed consent                            was obtained. Prior Anticoagulants: The patient has                            taken no previous anticoagulant or antiplatelet                            agents. ASA Grade Assessment: II - A patient with                            mild systemic disease. After reviewing the risks                            and benefits, the patient was deemed in                            satisfactory condition to undergo the procedure.                           After obtaining informed consent, the colonoscope  was passed under direct vision. Throughout the                            procedure, the patient's blood pressure, pulse, and                            oxygen saturations were monitored continuously. The                            CF [id number] #[id number] was introduced through the anus                            and advanced to the the terminal ileum. The                            colonoscopy  was performed without difficulty. The                            patient tolerated the procedure well. The quality                            of the bowel preparation was good. The terminal                            ileum, ileocecal valve, appendiceal orifice, and                            rectum were photographed. Scope In: 9:38:32 AM Scope Out: 9:52:08 AM Scope Withdrawal Time: 0 hours 10 minutes 55 seconds  Total Procedure Duration: 0 hours 13 minutes 36 seconds  Findings:                 Hemorrhoids were found on perianal exam.                           The colon appeared normal. Biopsies for histology                            were taken with a cold forceps from the right colon                            and left colon for evaluation of microscopic                            colitis. Estimated blood loss was minimal.                           Non-bleeding internal hemorrhoids were found during                            retroflexion. The hemorrhoids were small and Grade                            II (internal hemorrhoids that prolapse but reduce  spontaneously).                           The terminal ileum appeared normal. Complications:            No immediate complications. Estimated Blood Loss:     Estimated blood loss was minimal. Impression:               - Hemorrhoids found on perianal exam.                           - The entire examined colon is normal. Biopsied.                           - Non-bleeding internal hemorrhoids.                           - The examined portion of the ileum was normal. Recommendation:           - Patient has a contact number available for                            emergencies. The signs and symptoms of potential                            delayed complications were discussed with the                            patient. Return to normal activities tomorrow.                            Written discharge instructions  were provided to the                            patient.                           - Resume previous diet.                           - Continue present medications.                           - Await pathology results.                           - Repeat colonoscopy in 10 years for screening                            purposes.                           - Use fiber, for example Citrucel, Fibercon, Konsyl                            or Metamucil.                           -  Internal hemorrhoids were noted on this study and                            may be amenable to hemorrhoid band ligation. If you                            are interested in further treatment of these                            hemorrhoids with band ligation, please contact my                            clinic to set up an appointment for evaluation and                            treatment. Gerrit Heck, MD 09/26/2021 10:03:48 AM

## 2021-09-26 NOTE — Progress Notes (Signed)
Called to room to assist during endoscopic procedure.  Patient ID and intended procedure confirmed with present staff. Received instructions for my participation in the procedure from the performing physician.  

## 2021-09-26 NOTE — Patient Instructions (Signed)
Please read handouts provided. Continue present medications. Await pathology results. Increase Prilosec ( omeprazole ) to 40 mg twice daily for 6 weeks, then reduce to 40 mg daily and titrate to lowest effective dose to control reflux. Repeat upper endoscopy in 6-8 weeks. Return to GI clinic in 2-3 months. Repeat colonoscopy in 10 years for screening. Consider a fiber supplement.   YOU HAD AN ENDOSCOPIC PROCEDURE TODAY AT [facility]:   Refer to the procedure report that was given to you for any specific questions about what was found during the examination.  If the procedure report does not answer your questions, please call your gastroenterologist to clarify.  If you requested that your care partner not be given the details of your procedure findings, then the procedure report has been included in a sealed envelope for you to review at your convenience later.  YOU SHOULD EXPECT: Some feelings of bloating in the abdomen. Passage of more gas than usual.  Walking can help get rid of the air that was put into your GI tract during the procedure and reduce the bloating. If you had a lower endoscopy (such as a colonoscopy or flexible sigmoidoscopy) you may notice spotting of blood in your stool or on the toilet paper. If you underwent a bowel prep for your procedure, you may not have a normal bowel movement for a few days.  Please Note:  You might notice some irritation and congestion in your nose or some drainage.  This is from the oxygen used during your procedure.  There is no need for concern and it should clear up in a day or so.  SYMPTOMS TO REPORT IMMEDIATELY:  Following lower endoscopy (colonoscopy or flexible sigmoidoscopy):  Excessive amounts of blood in the stool  Significant tenderness or worsening of abdominal pains  Swelling of the abdomen that is new, acute  Fever of 100F or higher  Following upper endoscopy (EGD)  Vomiting of blood or coffee ground material  New  chest pain or pain under the shoulder blades  Painful or persistently difficult swallowing  New shortness of breath  Fever of 100F or higher  Black, tarry-looking stools  For urgent or emergent issues, a gastroenterologist can be reached at any hour by calling [phone]. Do not use MyChart messaging for urgent concerns.    DIET:  We do recommend a small meal at first, but then you may proceed to your regular diet.  Drink plenty of fluids but you should avoid alcoholic beverages for 24 hours.  ACTIVITY:  You should plan to take it easy for the rest of today and you should NOT DRIVE or use heavy machinery until tomorrow (because of the sedation medicines used during the test).    FOLLOW UP: Our staff will call the number listed on your records 48-72 hours following your procedure to check on you and address any questions or concerns that you may have regarding the information given to you following your procedure. If we do not reach you, we will leave a message.  We will attempt to reach you two times.  During this call, we will ask if you have developed any symptoms of COVID 19. If you develop any symptoms (ie: fever, flu-like symptoms, shortness of breath, cough etc.) before then, please call [phone].  If you test positive for Covid 19 in the 2 weeks post procedure, please call and report this information to [place].    If any biopsies were taken you will be contacted  by phone or by letter within the next 1-3 weeks.  Please call us at [phone] if you have not heard about the biopsies in 3 weeks.    SIGNATURES/CONFIDENTIALITY: You and/or your care partner have signed paperwork which will be entered into your electronic medical record.  These signatures attest to the fact that that the information above on your After Visit Summary has been reviewed and is understood.  Full responsibility of the confidentiality of this discharge information lies with you and/or your care-partner.

## 2021-09-26 NOTE — Progress Notes (Signed)
Pt in recovery with monitors in place, VSS. Report given to receiving RN. Bite guard was placed with pt awake to ensure comfort. No dental or soft tissue damage noted. 

## 2021-09-26 NOTE — Progress Notes (Signed)
VS completed by DT.    Medical history reviewed and updated.  

## 2021-09-26 NOTE — Progress Notes (Signed)
GASTROENTEROLOGY PROCEDURE H&P NOTE   Primary Care Physician: [name], NP    Reason for Procedure:  Nausea/vomiting, abdominal pain, hematochezia, diarrhea, leukocytosis  Plan:    EGD, colonoscopy  Patient is appropriate for endoscopic procedure(s) in the ambulatory ([facility]) setting.  The nature of the procedure, as well as the risks, benefits, and alternatives were carefully and thoroughly reviewed with the patient. Ample time for discussion and questions allowed. The patient understood, was satisfied, and agreed to proceed.     HPI: Wanda Mays is a 45 y.o. female who presents for EGD and colonoscopy for evaluation of multiple GI symptoms, to include nausea/vomiting, diarrhea, medic easier, abdominal pain, abdominal cramping.  Evaluation to date notable for leukocytosis (resolved on repeat), FOBT positive stool with otherwise normal labs and CT abdomen/pelvis.  Patient was most recently seen in the Gastroenterology Clinic on 09/16/2021 by me.  Trialed course of Levsin and low FODMAP diet, and has referral in place to GYN for evaluation of Endometriosis.  Stool studies collected and pending.  No interval change in medical history since that appointment. Please refer to that note for full details regarding GI history and clinical presentation.   Past Medical History:  Diagnosis Date   ADD (attention deficit disorder)    Bipolar 1 disorder ([place])    Endometriosis    GERD (gastroesophageal reflux disease)    Hiatal hernia    Hypercholesteremia    Hypertension    Lung nodule seen on imaging study    OCD (obsessive compulsive disorder)     Past Surgical History:  Procedure Laterality Date   ABDOMINAL HYSTERECTOMY     CESAREAN SECTION     CHOLECYSTECTOMY     COLONOSCOPY     around 2007 Frankey Poot did have polyps   LAPAROSCOPIC ABDOMINAL EXPLORATION      Prior to Admission medications   Medication Sig Start Date End Date Taking? Authorizing Provider  ADVAIR  DISKUS 250-50 MCG/ACT AEPB Inhale 1 puff into the lungs 2 (two) times daily. 08/12/21   [provider]  albuterol (VENTOLIN HFA) 108 (90 Base) MCG/ACT inhaler Inhale 2 puffs into the lungs every 6 (six) hours as needed for wheezing or shortness of breath. 08/05/21   Hassell Done, Mary-Margaret, FNP  dicyclomine (BENTYL) 20 MG tablet Take 1 tablet (20 mg total) by mouth 2 (two) times daily. 09/11/21   Truddie Hidden, MD  diphenhydramine-acetaminophen (TYLENOL PM) 25-500 MG TABS tablet Take 1 tablet by mouth at bedtime as needed (sleep). 08/30/21   Revonda Humphrey, NP  DULoxetine (CYMBALTA) 60 MG capsule Take 60 mg by mouth daily. 08/20/21   [provider]  hydrochlorothiazide (HYDRODIURIL) 25 MG tablet Take 25 mg by mouth daily. 08/20/21   [provider]  hydrOXYzine (ATARAX/VISTARIL) 25 MG tablet Take 1 tablet (25 mg total) by mouth 3 (three) times daily as needed for anxiety. 08/30/21   Revonda Humphrey, NP  hyoscyamine (LEVSIN SL) 0.125 MG SL tablet Place 1 tablet (0.125 mg total) under the tongue every 6 (six) hours as needed for cramping. 09/16/21 10/16/21  Sharonica Kraszewski V, DO  ipratropium-albuterol (DUONEB) 0.5-2.5 (3) MG/3ML SOLN Take 3 mLs by nebulization every 4 (four) hours as needed. 09/17/21   Hassell Done Mary-Margaret, FNP  metoCLOPramide (REGLAN) 10 MG tablet Take 1 tablet (10 mg total) by mouth every 8 (eight) hours as needed for nausea. 08/23/21   Caccavale, Sophia, PA-C  omeprazole (PRILOSEC) 20 MG capsule Take 1 capsule (20 mg total) by mouth daily. 08/03/21  Blanchie Dessert, MD  ondansetron (ZOFRAN) 4 MG tablet Take 1 tablet (4 mg total) by mouth every 8 (eight) hours as needed for nausea or vomiting. 09/22/21   [name], NP  oxyCODONE (ROXICODONE) 5 MG immediate release tablet Take 1 tablet (5 mg total) by mouth every 6 (six) hours as needed for up to 12 days for severe pain. 09/15/21 09/27/21  Wyvonnia Dusky, MD  potassium chloride (KLOR-CON) 10 MEQ  tablet Take 10 mEq by mouth daily. 08/26/21   [provider]  potassium chloride SA (KLOR-CON) 20 MEQ tablet Take 1 tablet (20 mEq total) by mouth daily. Take for 3 days. (Total of 20 mEq) 09/07/21 12/06/21  Tobb, Godfrey Pick, DO  propranolol (INDERAL) 40 MG tablet Take 1 tablet (40 mg total) by mouth every 8 (eight) hours. 09/02/21   Tobb, Godfrey Pick, DO    Current Outpatient Medications  Medication Sig Dispense Refill   ADVAIR DISKUS 250-50 MCG/ACT AEPB Inhale 1 puff into the lungs 2 (two) times daily.     albuterol (VENTOLIN HFA) 108 (90 Base) MCG/ACT inhaler Inhale 2 puffs into the lungs every 6 (six) hours as needed for wheezing or shortness of breath. 18 g 0   dicyclomine (BENTYL) 20 MG tablet Take 1 tablet (20 mg total) by mouth 2 (two) times daily. 20 tablet 0   diphenhydramine-acetaminophen (TYLENOL PM) 25-500 MG TABS tablet Take 1 tablet by mouth at bedtime as needed (sleep). 14 tablet    DULoxetine (CYMBALTA) 60 MG capsule Take 60 mg by mouth daily.     hydrochlorothiazide (HYDRODIURIL) 25 MG tablet Take 25 mg by mouth daily.     hydrOXYzine (ATARAX/VISTARIL) 25 MG tablet Take 1 tablet (25 mg total) by mouth 3 (three) times daily as needed for anxiety. 42 tablet 0   hyoscyamine (LEVSIN SL) 0.125 MG SL tablet Place 1 tablet (0.125 mg total) under the tongue every 6 (six) hours as needed for cramping. 30 tablet 3   ipratropium-albuterol (DUONEB) 0.5-2.5 (3) MG/3ML SOLN Take 3 mLs by nebulization every 4 (four) hours as needed. 72 mL 1   metoCLOPramide (REGLAN) 10 MG tablet Take 1 tablet (10 mg total) by mouth every 8 (eight) hours as needed for nausea. 20 tablet 0   omeprazole (PRILOSEC) 20 MG capsule Take 1 capsule (20 mg total) by mouth daily. 20 capsule 0   ondansetron (ZOFRAN) 4 MG tablet Take 1 tablet (4 mg total) by mouth every 8 (eight) hours as needed for nausea or vomiting. 15 tablet 0   oxyCODONE (ROXICODONE) 5 MG immediate release tablet Take 1 tablet (5 mg total) by mouth every 6  (six) hours as needed for up to 12 days for severe pain. 12 tablet 0   potassium chloride (KLOR-CON) 10 MEQ tablet Take 10 mEq by mouth daily.     potassium chloride SA (KLOR-CON) 20 MEQ tablet Take 1 tablet (20 mEq total) by mouth daily. Take for 3 days. (Total of 20 mEq) 3 tablet 0   propranolol (INDERAL) 40 MG tablet Take 1 tablet (40 mg total) by mouth every 8 (eight) hours. 216 tablet 4   No current facility-administered medications for this visit.    Allergies as of 09/26/2021 - Review Complete 09/17/2021  Allergen Reaction Noted   Aspirin  08/03/2021   Penicillins  08/03/2021    Family History  Problem Relation Age of Onset   Clotting disorder Mother        TIA's   Diabetes type II Mother    Hypertension  Mother    Hypertension Father    Diabetes type II Father    Heart Problems Father    Hypertension Sister    Diabetes type II Sister    Heart murmur Brother    Asthma Brother    Atrial fibrillation Maternal Grandmother    Diabetes Mellitus I Paternal Grandfather    Heart Problems Paternal Grandfather    Lung cancer Paternal Grandfather    Asthma Daughter    ADD / ADHD Daughter    ADD / ADHD Son    Asthma Son     Social History   Socioeconomic History   Marital status: Married    Spouse name: Harrell Gave   Number of children: Not on file   Years of education: Not on file   Highest education level: Not on file  Occupational History   Not on file  Tobacco Use   Smoking status: Every Day    Packs/day: 1.00    Years: 1.00    Pack years: 1.00    Types: Cigarettes    Passive exposure: Current   Smokeless tobacco: Never  Vaping Use   Vaping Use: Never used  Substance and Sexual Activity   Alcohol use: Not Currently   Drug use: Not Currently   Sexual activity: Not Currently  Other Topics Concern   Not on file  Social History Narrative   Patient is currently unemployed   CMA, has been doing customer service   Married   5 children   [place]   2006 [place] (son)      2 children local and 3 in [place]      Enjoys crafts, playing with kids, plays piano   1 cat and 1 dog   Completed associates degree   Social Determinants of Radio broadcast assistant Strain: Not on file  Food Insecurity: Not on file  Transportation Needs: Not on file  Physical Activity: Inactive   Days of Exercise per Week: 0 days   Minutes of Exercise per Session: 0 min  Stress: Not on file  Social Connections: Not on file  Intimate Partner Violence: Not on file    Physical Exam: Vital signs in last 24 hours: @There  were no vitals taken for this visit. GEN: NAD EYE: Sclerae anicteric ENT: MMM CV: Non-tachycardic Pulm: CTA b/l GI: Soft, NT/ND NEURO:  Alert & Oriented x 3   Gerrit Heck, DO [facility]   09/26/2021 9:01 AM

## 2021-09-26 NOTE — Op Note (Signed)
[facility] Patient Name: Wanda Mays Procedure Date: 09/26/2021 9:04 AM MRN: [id number] Endoscopist: Gerrit Heck , MD Age: 45 Referring MD:  Date of Birth: [birth year] Gender: Female Account #: [id number] Procedure:                Upper GI endoscopy Indications:              Generalized abdominal pain, Suspected esophageal                            reflux, Diarrhea, Nausea with vomiting Medicines:                Monitored Anesthesia Care Procedure:                Pre-Anesthesia Assessment:                           - Prior to the procedure, a History and Physical                            was performed, and patient medications and                            allergies were reviewed. The patient's tolerance of                            previous anesthesia was also reviewed. The risks                            and benefits of the procedure and the sedation                            options and risks were discussed with the patient.                            All questions were answered, and informed consent                            was obtained. Prior Anticoagulants: The patient has                            taken no previous anticoagulant or antiplatelet                            agents. ASA Grade Assessment: II - A patient with                            mild systemic disease. After reviewing the risks                            and benefits, the patient was deemed in                            satisfactory condition to undergo the procedure.  After obtaining informed consent, the endoscope was                            passed under direct vision. Throughout the                            procedure, the patient's blood pressure, pulse, and                            oxygen saturations were monitored continuously. The                            GIF [id number] #[id number] was introduced through the                            mouth, and advanced  to the third part of duodenum.                            The upper GI endoscopy was accomplished without                            difficulty. The patient tolerated the procedure                            well. Scope In: Scope Out: Findings:                 LA Grade B (one or more mucosal breaks greater than                            5 mm, not extending between the tops of two mucosal                            folds) esophagitis with no bleeding was found in                            the lower third of the esophagus.                           The entire examined stomach was normal. Biopsies                            were taken with a cold forceps for Helicobacter                            pylori testing. Estimated blood loss was minimal.                           The examined duodenum was normal. Biopsies were                            taken with a cold forceps for histology. Estimated  blood loss was minimal. Complications:            No immediate complications. Estimated Blood Loss:     Estimated blood loss was minimal. Impression:               - LA Grade B reflux esophagitis with no bleeding.                           - Normal stomach. Biopsied.                           - Normal examined duodenum. Biopsied. Recommendation:           - Patient has a contact number available for                            emergencies. The signs and symptoms of potential                            delayed complications were discussed with the                            patient. Return to normal activities tomorrow.                            Written discharge instructions were provided to the                            patient.                           - Resume previous diet.                           - Continue present medications.                           - Await pathology results.                           - Increase Prilosec (omeprazole) to 40 mg PO BID                             for 6 weeks to promote mucosal healing, then reduce                            to 40 mg daily and will titrate to lowest effective                            dose to control reflux.                           - Repeat upper endoscopy in 6-8 weeks to check                            healing.                           -  Return to GI clinic in 2-3 months.                           - If nausea/vomiting persists despite high dose                            acid suppression, will plan for Gastric Emptying                            Study.                           - Colonoscopy today. Gerrit Heck, MD 09/26/2021 10:00:07 AM

## 2021-09-27 ENCOUNTER — Ambulatory Visit: Payer: 59 | Admitting: Family

## 2021-09-27 ENCOUNTER — Telehealth: Payer: Self-pay

## 2021-09-27 NOTE — Telephone Encounter (Signed)
Per 09/26/21 procedure report: - Repeat EGD in 6-8 week to check for healing. - Return to GI clinic in 2-3 months  Patient has been scheduled for a 71-month follow up with Dr. Bryan Lemma on Thursday, 12/01/21 at 3 PM. Pt notified of appt via my chart and a letter has been mailed. Repeat EGD is scheduled for Wednesday, 11/16/21 at 8 am.

## 2021-09-28 ENCOUNTER — Telehealth: Payer: Self-pay

## 2021-09-28 ENCOUNTER — Encounter: Payer: Self-pay | Admitting: Family

## 2021-09-28 DIAGNOSIS — R112 Nausea with vomiting, unspecified: Secondary | ICD-10-CM

## 2021-09-28 DIAGNOSIS — R1084 Generalized abdominal pain: Secondary | ICD-10-CM

## 2021-09-28 DIAGNOSIS — K2100 Gastro-esophageal reflux disease with esophagitis, without bleeding: Secondary | ICD-10-CM

## 2021-09-28 NOTE — Telephone Encounter (Signed)
  Follow up Call-  Call back number 09/26/2021  Post procedure Call Back phone  # [phone]  Permission to leave phone message Yes     Patient questions:  Do you have a fever, pain , or abdominal swelling? No. Pain Score  0 *  Have you tolerated food without any problems? No.  Have you been able to return to your normal activities? No.  Do you have any questions about your discharge instructions: Diet   No. Medications  No. Follow up visit  No.  Do you have questions or concerns about your Care? Yes.    Patient c/o continued symptoms such as nausea and vomiting, states she's has SOB for about 2 months, but has increased since yesterday, increased SOB not relieved with her inhalers and nebulizer treatment, states SOB is mild at rest and increased with exertion (such as walking to another room), denies fever. States she sneezes she has yellowish mucous drainage in her mouth, denies blood tinged sputum, states today she has had intermittent grayish/blue tinge to her hands that comes and goes, lack of appetite due to N/V, has emesis after eating meals and dry heaves when not eating. Patient able to carry on this telephone conversation without distress. Informed patient that Dr. Bryan Lemma will know more when her pathology results return and she may need to go to her primary care, but that I will send this message to Dr. Bryan Lemma and call her back this morning. Patient is to seek immediate medical care if symptoms worsen or in respiratory distress. Patient verbalizes understanding. Actions: * If pain score is 4 or above: No action needed, pain <4.

## 2021-09-28 NOTE — Telephone Encounter (Signed)
The nausea/vomiting was part of the indication for her EGD. We did find esophagitis, and increased PPI to high dose. If continued nausea/vomiting despite high dose PPI, next step is a Gastric Emptying Study. Can order that now and cancel if sxs abate with newly prescribed Rx.   For the SOB, no adverse events occurred during the case, and sxs predate recent EGD/Colo. Agree with plan to f/u with her PCM, and if sxs worsen or additional concerns, to go to ER for expedited evaluation.   Will f/u with her on path when these have resulted.  Thank you.

## 2021-09-28 NOTE — Telephone Encounter (Signed)
Spoke with patient in regard to recommendations. Pt states that she would like to proceed with GES, she states that both her dad and sister have gastroparesis. Advised patient to continue new prescribed medication until we receive GES and pathology results back. Pt is aware that she will need to follow up with PCP in regards to SOB. Pt is aware that if symptoms worsen she will need to go to the ED for evaluation. Pt is aware that radiology scheduling will contact her directly to schedule her GES appt. Pt verbalized understanding and had no concerns at the end of the call.  Secure staff message sent to radiology schedulers to contact patient to set up appt.

## 2021-09-28 NOTE — Telephone Encounter (Signed)
Returned patient's call after receiving recommendations from Dr. Bryan Lemma. Patient states she has not yet picked up her new prescription for Omeprazole at an increased dose. Advised patient to start this med as newly prescribed, see if this given her some relief, and, if not call our office to schedule gastric emptying study. Patient adds that her sister has gastroparesis and she is interested in scheduling a gastric emptying study, but will try her new RX first and call to schedule if symptoms not improved. Also await pathology results from her procedure visit. As far as her SOB is concerned, she did not experience any complications during her procedure that would account for increased SOB, advised patient to f/u with her PCP immediately to rule out unrelated illness/respiratory infection, etc. Patient verbalizes understanding and is agreeable with this plan.

## 2021-09-28 NOTE — Addendum Note (Signed)
Addended by: Yevette Edwards on: 09/28/2021 10:41 AM   Modules accepted: Orders

## 2021-09-30 ENCOUNTER — Telehealth: Payer: Self-pay

## 2021-09-30 ENCOUNTER — Encounter: Payer: Self-pay | Admitting: Family

## 2021-09-30 NOTE — Telephone Encounter (Signed)
Dr. Tarri Glenn as DOD AM of 09/30/21 - Please advise, thanks  Received a call report from [facility] at [facility] in regards to GI profile stool, PCR.   Enteropathogenic E. Coli was detected.

## 2021-09-30 NOTE — Telephone Encounter (Signed)
Pt sent a my chart message earlier in regards to results. I have sent her recommendations to her via my chart.

## 2021-10-03 ENCOUNTER — Other Ambulatory Visit: Payer: Self-pay

## 2021-10-03 ENCOUNTER — Ambulatory Visit (INDEPENDENT_AMBULATORY_CARE_PROVIDER_SITE_OTHER): Payer: 59 | Admitting: Family

## 2021-10-03 ENCOUNTER — Telehealth: Payer: Self-pay | Admitting: Family

## 2021-10-03 ENCOUNTER — Encounter: Payer: Self-pay | Admitting: Family

## 2021-10-03 ENCOUNTER — Other Ambulatory Visit (HOSPITAL_BASED_OUTPATIENT_CLINIC_OR_DEPARTMENT_OTHER): Payer: Self-pay

## 2021-10-03 VITALS — BP 140/91 | HR 85 | Temp 98.0°F | Resp 16 | Wt 210.0 lb

## 2021-10-03 DIAGNOSIS — N6320 Unspecified lump in the left breast, unspecified quadrant: Secondary | ICD-10-CM

## 2021-10-03 DIAGNOSIS — E0590 Thyrotoxicosis, unspecified without thyrotoxic crisis or storm: Secondary | ICD-10-CM | POA: Diagnosis not present

## 2021-10-03 DIAGNOSIS — N644 Mastodynia: Secondary | ICD-10-CM | POA: Diagnosis not present

## 2021-10-03 DIAGNOSIS — K921 Melena: Secondary | ICD-10-CM

## 2021-10-03 DIAGNOSIS — R0602 Shortness of breath: Secondary | ICD-10-CM | POA: Insufficient documentation

## 2021-10-03 DIAGNOSIS — R911 Solitary pulmonary nodule: Secondary | ICD-10-CM

## 2021-10-03 DIAGNOSIS — R42 Dizziness and giddiness: Secondary | ICD-10-CM

## 2021-10-03 DIAGNOSIS — F319 Bipolar disorder, unspecified: Secondary | ICD-10-CM

## 2021-10-03 DIAGNOSIS — R109 Unspecified abdominal pain: Secondary | ICD-10-CM

## 2021-10-03 LAB — CBC WITH DIFFERENTIAL/PLATELET
Basophils Absolute: 0 10*3/uL (ref 0.0–0.1)
Basophils Relative: 0.5 % (ref 0.0–3.0)
Eosinophils Absolute: 0.1 10*3/uL (ref 0.0–0.7)
Eosinophils Relative: 1 % (ref 0.0–5.0)
HCT: 42.2 % (ref 36.0–46.0)
Hemoglobin: 14.4 g/dL (ref 12.0–15.0)
Lymphocytes Relative: 27.4 % (ref 12.0–46.0)
Lymphs Abs: 2.2 10*3/uL (ref 0.7–4.0)
MCHC: 34 g/dL (ref 30.0–36.0)
MCV: 85.8 fl (ref 78.0–100.0)
Monocytes Absolute: 0.5 10*3/uL (ref 0.1–1.0)
Monocytes Relative: 6.6 % (ref 3.0–12.0)
Neutro Abs: 5.1 10*3/uL (ref 1.4–7.7)
Neutrophils Relative %: 64.5 % (ref 43.0–77.0)
Platelets: 306 10*3/uL (ref 150.0–400.0)
RBC: 4.92 Mil/uL (ref 3.87–5.11)
RDW: 13.4 % (ref 11.5–15.5)
WBC: 7.9 10*3/uL (ref 4.0–10.5)

## 2021-10-03 MED ORDER — ONDANSETRON 4 MG PO TBDP
4.0000 mg | ORAL_TABLET | Freq: Three times a day (TID) | ORAL | 0 refills | Status: DC | PRN
  Filled 2021-10-03: qty 9, 3d supply, fill #0

## 2021-10-03 MED ORDER — CLINDAMYCIN HCL 300 MG PO CAPS
300.0000 mg | ORAL_CAPSULE | Freq: Four times a day (QID) | ORAL | 0 refills | Status: DC
  Filled 2021-10-03: qty 28, 7d supply, fill #0

## 2021-10-03 MED ORDER — MECLIZINE HCL 25 MG PO TABS
25.0000 mg | ORAL_TABLET | Freq: Three times a day (TID) | ORAL | 0 refills | Status: DC | PRN
  Filled 2021-10-03: qty 30, 10d supply, fill #0

## 2021-10-03 NOTE — Assessment & Plan Note (Signed)
New- also with some mass thickening. Will rx with empiric clindamycin 300mg  QID and obtain diagnostic Mammo/US.

## 2021-10-03 NOTE — Assessment & Plan Note (Signed)
Ongoing depression sxs.  Will see if we can get her into psychiatry.

## 2021-10-03 NOTE — Assessment & Plan Note (Addendum)
2D echo looked good.  CTA neg for PE.  ? Asthma, ? Post covid syndrome, ? Deconditioning, ? Anxiety.  Will defer further work up to pulmonology and cardiology.

## 2021-10-03 NOTE — Assessment & Plan Note (Signed)
Unchanged.  CT abdomen/pelvis unremarkable. Await GYN input re: endometriosis as potential cause.

## 2021-10-03 NOTE — Progress Notes (Addendum)
Subjective:   By signing my name below, I, Wanda Mays, attest that this documentation has been prepared under the direction and in the presence of [name] NP. 10/03/2021    Patient ID: Wanda Mays, female    DOB: 10/08/1976, 45 y.o.   MRN: [id number]  Chief Complaint  Patient presents with   Breast Mass    Complains of left breast lump    Mass    Patient reports having a painful lump under left armpit    Fatigue    Complains of feeling very fatigue    Shortness of Breath    Complains of SOB "getting worse"    HPI Patient is in today for a office visit.  She complains of groin pain, loss of appetite, extreme fatigue, vision issues, stomach cramps, night sweats.   Nipple drainage- She reports having left nipple drainage with blood for the past 2 months. She now reports that her left under arm is swollen and painful. Her last mammogram was in 2011 and she is interested in setting up an appointment to get it completed.  Shortness of breath- She complains of her SOB worsening for the past couple of weeks. She continues using a nebulizer to manage her symptoms. Her EKG a couple of weeks ago had normal results. She also gets dizziness and nausea when having an episode of SOB. She is requesting medication to manage her nausea.   Lung- She was found to have a nodule on her lung and has an upcoming appointment with a pulmonology specialist.  Thyroid- Her last thyroid labs showed her thyroid function was normal.  Colonoscopy- hemorrhoids were found in her last procedure.  Psychologist- She has not set up an appointment at this time.  Abdominal pain- Her Ct results were normal. She has an upcoming appointment with a GYN specialist to manage it.  Chest pain- Her chest CT showed no blood clot.  Depression/Bi-polar- She continues having depression. She struggles to get up from her bed and complete daily activities. She notes her children have left her to [place] at this time.     Health Maintenance Due  Topic Date Due   COVID-19 Vaccine (1) Never done   HIV Screening  Never done   Hepatitis C Screening  Never done   TETANUS/TDAP  Never done   PAP SMEAR-Modifier  Never done   INFLUENZA VACCINE  Never done    Past Medical History:  Diagnosis Date   ADD (attention deficit disorder)    Bipolar 1 disorder (HCC)    Endometriosis    GERD (gastroesophageal reflux disease)    Hiatal hernia    Hypercholesteremia    Hypertension    Lung nodule seen on imaging study    OCD (obsessive compulsive disorder)     Past Surgical History:  Procedure Laterality Date   ABDOMINAL HYSTERECTOMY     CESAREAN SECTION     CHOLECYSTECTOMY     COLONOSCOPY     around 2007 Tressie Ellis Flordia did have polyps   LAPAROSCOPIC ABDOMINAL EXPLORATION     UPPER GASTROINTESTINAL ENDOSCOPY      Family History  Problem Relation Age of Onset   Clotting disorder Mother        TIA's   Diabetes type II Mother    Hypertension Mother    Hypertension Father    Diabetes type II Father    Heart Problems Father    Hypertension Sister    Diabetes type II Sister    Heart murmur Brother  Asthma Brother    Atrial fibrillation Maternal Grandmother    Diabetes Mellitus I Paternal Grandfather    Heart Problems Paternal Grandfather    Lung cancer Paternal Grandfather    Asthma Daughter    ADD / ADHD Daughter    ADD / ADHD Son    Asthma Son    Colon cancer Neg Hx    Rectal cancer Neg Hx    Stomach cancer Neg Hx     Social History   Socioeconomic History   Marital status: Married    Spouse name: Harrell Gave   Number of children: Not on file   Years of education: Not on file   Highest education level: Not on file  Occupational History   Not on file  Tobacco Use   Smoking status: Every Day    Packs/day: 1.00    Years: 1.00    Pack years: 1.00    Types: Cigarettes    Passive exposure: Current   Smokeless tobacco: Never  Vaping Use   Vaping Use: Never used  Substance and  Sexual Activity   Alcohol use: Not Currently   Drug use: Not Currently   Sexual activity: Not Currently    Birth control/protection: Surgical    Comment: Partial hysterectomy  Other Topics Concern   Not on file  Social History Narrative   Patient is currently unemployed   CMA, has been doing customer service   Married   5 children   [place]   2006 [place] (son)      2 children local and 3 in [place]      Enjoys crafts, playing with kids, plays piano   1 cat and 1 dog   Completed associates degree   Social Determinants of Radio broadcast assistant Strain: Not on file  Food Insecurity: Not on file  Transportation Needs: Not on file  Physical Activity: Inactive   Days of Exercise per Week: 0 days   Minutes of Exercise per Session: 0 min  Stress: Not on file  Social Connections: Not on file  Intimate Partner Violence: Not on file    Outpatient Medications Prior to Visit  Medication Sig Dispense Refill   ADVAIR DISKUS 250-50 MCG/ACT AEPB Inhale 1 puff into the lungs 2 (two) times daily.     albuterol (VENTOLIN HFA) 108 (90 Base) MCG/ACT inhaler Inhale 2 puffs into the lungs every 6 (six) hours as needed for wheezing or shortness of breath. 18 g 0   dicyclomine (BENTYL) 20 MG tablet Take 1 tablet (20 mg total) by mouth 2 (two) times daily. 20 tablet 0   diphenhydramine-acetaminophen (TYLENOL PM) 25-500 MG TABS tablet Take 1 tablet by mouth at bedtime as needed (sleep). 14 tablet    DULoxetine (CYMBALTA) 60 MG capsule Take 60 mg by mouth daily.     hydrochlorothiazide (HYDRODIURIL) 25 MG tablet Take 25 mg by mouth daily.     hydrOXYzine (ATARAX/VISTARIL) 25 MG tablet Take 1 tablet (25 mg total) by mouth 3 (three) times daily as needed for anxiety. 42 tablet 0   hyoscyamine (LEVSIN SL) 0.125 MG SL tablet Place 1 tablet (0.125 mg total) under the tongue every 6 (six) hours as needed for cramping. 30 tablet 3    ipratropium-albuterol (DUONEB) 0.5-2.5 (3) MG/3ML SOLN Take 3 mLs by nebulization every 4 (four) hours as needed. 72 mL 1   metoCLOPramide (REGLAN) 10 MG tablet Take 1 tablet (10 mg  total) by mouth every 8 (eight) hours as needed for nausea. 20 tablet 0   omeprazole (PRILOSEC) 40 MG capsule Take 1 capsule (40 mg total) by mouth 2 (two) times daily. Prilosec 40 mg twice daily for 6 weeks, then reduce to 40 mg daily and titrate to lowest effective dose to control reflux. 90 capsule 3   ondansetron (ZOFRAN) 4 MG tablet Take 1 tablet (4 mg total) by mouth every 8 (eight) hours as needed for nausea or vomiting. 15 tablet 0   potassium chloride (KLOR-CON) 10 MEQ tablet Take 10 mEq by mouth daily.     potassium chloride SA (KLOR-CON) 20 MEQ tablet Take 1 tablet (20 mEq total) by mouth daily. Take for 3 days. (Total of 20 mEq) (Patient not taking: Reported on 09/26/2021) 3 tablet 0   propranolol (INDERAL) 40 MG tablet Take 1 tablet (40 mg total) by mouth every 8 (eight) hours. 216 tablet 4   traZODone (DESYREL) 100 MG tablet Take 100-200 mg by mouth at bedtime as needed.     No facility-administered medications prior to visit.    Allergies  Allergen Reactions   Aspirin    Penicillins     Review of Systems  Constitutional:  Positive for malaise/fatigue.       (+)nipple drainage (+)loss of appetite (+)night sweats  Eyes:        (+)vision issues  Respiratory:  Positive for shortness of breath.   Gastrointestinal:  Positive for abdominal pain and nausea.       (+)stomach cramps  Musculoskeletal:        (+)groin pain  Neurological:  Positive for dizziness.      Objective:    Physical Exam Constitutional:      General: She is not in acute distress.    Appearance: Normal appearance. She is not ill-appearing.  HENT:     Head: Normocephalic and atraumatic.     Right Ear: External ear normal.     Left Ear: External ear normal.  Eyes:     Extraocular Movements: Extraocular movements intact.      Pupils: Pupils are equal, round, and reactive to light.  Cardiovascular:     Rate and Rhythm: Normal rate and regular rhythm.     Heart sounds: Normal heart sounds. No murmur heard.   No gallop.  Pulmonary:     Effort: Pulmonary effort is normal. No respiratory distress.     Breath sounds: Normal breath sounds. No wheezing or rales.  Chest:     Comments: Thickening of left breast one o'clock Left ancillary lymphadenopathy  Skin:    General: Skin is warm and dry.  Neurological:     Mental Status: She is alert and oriented to person, place, and time.  Psychiatric:        Behavior: Behavior normal.    BP (!) 140/91 (BP Location: Right Arm, Patient Position: Sitting, Cuff Size: Small)   Pulse 85   Temp 98 F (36.7 C) (Oral)   Resp 16   Wt 210 lb (95.3 kg)   SpO2 96%   BMI 37.20 kg/m  Wt Readings from Last 3 Encounters:  10/03/21 210 lb (95.3 kg)  09/26/21 218 lb (98.9 kg)  09/16/21 218 lb 6 oz (99.1 kg)       Assessment & Plan:   Problem List Items Addressed This Visit       Unprioritized   Shortness of breath    2D echo looked good.  CTA neg for PE.  ? Asthma, ?  Post covid syndrome, ? Deconditioning, ? Anxiety.  Will defer further work up to pulmonology and cardiology.      Mastalgia    New- also with some mass thickening. Will rx with empiric clindamycin 300mg  QID and obtain diagnostic Mammo/US.      Relevant Orders   CBC with Differential/Platelet (Completed)   Lung nodule    New. She is advised to keep upcoming appointment with pulmonoary.        Hyperthyroidism    Follow up TFT's were normal.       Hematochezia    Still having some occasional bleeding. Colo was revealing       Dizziness    New. Trial of prn meclizine.       Bipolar 1 disorder ([place])    Ongoing depression sxs.  Will see if we can get her into psychiatry.       Abdominal pain    Unchanged.  CT abdomen/pelvis unremarkable. Await GYN input re: endometriosis as potential cause.        Other Visit Diagnoses     Mass of left breast, unspecified quadrant    -  Primary   Relevant Orders   MM Digital Diagnostic Bilat   US BREAST COMPLETE UNI LEFT INC AXILLA        Meds ordered this encounter  Medications   clindamycin (CLEOCIN) 300 MG capsule    Sig: Take 1 capsule (300 mg total) by mouth 4 (four) times daily.    Dispense:  28 capsule    Refill:  0    Order Specific Question:   Supervising Provider    Answer:   Penni Homans A [[id number]]   meclizine (ANTIVERT) 25 MG tablet    Sig: Take 1 tablet (25 mg total) by mouth 3 (three) times daily as needed for dizziness.    Dispense:  30 tablet    Refill:  0    Order Specific Question:   Supervising Provider    Answer:   Penni Homans A [[id number]]   ondansetron (ZOFRAN ODT) 4 MG disintegrating tablet    Sig: Dissolve 1 tablet (4 mg total) by mouth every 8 (eight) hours as needed for nausea or vomiting.    Dispense:  20 tablet    Refill:  0    Order Specific Question:   Supervising Provider    Answer:   Penni Homans A [[id number]]    I, [name] NP, personally preformed the services described in this documentation.  All medical record entries made by the scribe were at my direction and in my presence.  I have reviewed the chart and discharge instructions (if applicable) and agree that the record reflects my personal performance and is accurate and complete. 10/03/2021   I,Wanda Mays,acting as a Education administrator for Nance Pear, NP.,have documented all relevant documentation on the behalf of Nance Pear, NP,as directed by  Nance Pear, NP while in the presence of Nance Pear, NP.   Nance Pear, NP

## 2021-10-03 NOTE — Assessment & Plan Note (Signed)
Still having some occasional bleeding. Colo was revealing

## 2021-10-03 NOTE — Telephone Encounter (Signed)
See mychart.  

## 2021-10-03 NOTE — Patient Instructions (Signed)
You should be contacted about scheduling your appointment at the [facility]. Please begin clindamycin for breast drainage.

## 2021-10-03 NOTE — Assessment & Plan Note (Signed)
New. Trial of prn meclizine.

## 2021-10-03 NOTE — Assessment & Plan Note (Signed)
New. She is advised to keep upcoming appointment with pulmonoary.

## 2021-10-03 NOTE — Assessment & Plan Note (Signed)
Follow up TFT's were normal.

## 2021-10-04 LAB — GI PROFILE, STOOL, PCR

## 2021-10-04 LAB — CALPROTECTIN, FECAL: Calprotectin, Fecal: <16 ug/g (ref 0–120)

## 2021-10-04 NOTE — Telephone Encounter (Signed)
Yes, would expect throat soreness to be related to the nausea/vomiting she has been having.  Glad to hear that she is otherwise without any vomiting the last few days.  Keep plan for [facility] as scheduled.

## 2021-10-11 ENCOUNTER — Encounter: Payer: Self-pay | Admitting: Family

## 2021-10-11 ENCOUNTER — Other Ambulatory Visit: Payer: Self-pay

## 2021-10-11 ENCOUNTER — Ambulatory Visit (INDEPENDENT_AMBULATORY_CARE_PROVIDER_SITE_OTHER): Payer: 59 | Admitting: Family

## 2021-10-11 VITALS — BP 107/58 | HR 79 | Temp 98.6°F | Resp 16 | Wt 215.0 lb

## 2021-10-11 DIAGNOSIS — N644 Mastodynia: Secondary | ICD-10-CM

## 2021-10-11 DIAGNOSIS — M2550 Pain in unspecified joint: Secondary | ICD-10-CM

## 2021-10-11 DIAGNOSIS — I10 Essential (primary) hypertension: Secondary | ICD-10-CM

## 2021-10-11 DIAGNOSIS — R590 Localized enlarged lymph nodes: Secondary | ICD-10-CM | POA: Diagnosis not present

## 2021-10-11 DIAGNOSIS — M791 Myalgia, unspecified site: Secondary | ICD-10-CM

## 2021-10-11 DIAGNOSIS — L304 Erythema intertrigo: Secondary | ICD-10-CM | POA: Diagnosis not present

## 2021-10-11 DIAGNOSIS — R0602 Shortness of breath: Secondary | ICD-10-CM

## 2021-10-11 DIAGNOSIS — R112 Nausea with vomiting, unspecified: Secondary | ICD-10-CM

## 2021-10-11 LAB — SEDIMENTATION RATE: Sed Rate: 15 mm/hr (ref 0–20)

## 2021-10-11 MED ORDER — POTASSIUM CHLORIDE ER 10 MEQ PO TBCR: 10.0000 meq | EXTENDED_RELEASE_TABLET | Freq: Every day | ORAL | 0 refills | Status: AC

## 2021-10-11 MED ORDER — MECLIZINE HCL 25 MG PO TABS: 25.0000 mg | ORAL_TABLET | Freq: Three times a day (TID) | ORAL | 0 refills | Status: DC | PRN

## 2021-10-11 MED ORDER — ACETAMINOPHEN-CODEINE #3 300-30 MG PO TABS: 1.0000 | ORAL_TABLET | Freq: Four times a day (QID) | ORAL | 0 refills | Status: DC | PRN

## 2021-10-11 MED ORDER — ADVAIR DISKUS 250-50 MCG/ACT IN AEPB: 1.0000 | INHALATION_SPRAY | Freq: Two times a day (BID) | RESPIRATORY_TRACT | 2 refills | Status: AC

## 2021-10-11 MED ORDER — HYDROCHLOROTHIAZIDE 25 MG PO TABS: 25.0000 mg | ORAL_TABLET | Freq: Every day | ORAL | 1 refills | Status: DC

## 2021-10-11 MED ORDER — ONDANSETRON HCL 4 MG PO TABS: 4.0000 mg | ORAL_TABLET | Freq: Three times a day (TID) | ORAL | 1 refills | Status: DC | PRN

## 2021-10-11 MED ORDER — GABAPENTIN 100 MG PO CAPS
100.0000 mg | ORAL_CAPSULE | Freq: Three times a day (TID) | ORAL | 3 refills | Status: DC
Start: 2021-10-11 — End: 2022-02-20

## 2021-10-11 MED ORDER — TRAZODONE HCL 100 MG PO TABS: 100.0000 mg | ORAL_TABLET | Freq: Every evening | ORAL | 0 refills | Status: DC | PRN

## 2021-10-11 MED ORDER — HYDROXYZINE HCL 25 MG PO TABS: 25.0000 mg | ORAL_TABLET | Freq: Three times a day (TID) | ORAL | 0 refills | Status: DC | PRN

## 2021-10-11 MED ORDER — NYSTATIN 100000 UNIT/GM EX CREA: 1.0000 "application " | TOPICAL_CREAM | Freq: Two times a day (BID) | CUTANEOUS | 0 refills | Status: DC

## 2021-10-11 NOTE — Assessment & Plan Note (Signed)
I don't think her breast pain is due to infection.  I suspect more of a neuropathic type pain that she is experiencing in her arms, breasts, neck.  She is scheduled for diagnostic mammo and left breast US. Will add R breast US due to new axillary LAD.

## 2021-10-11 NOTE — Telephone Encounter (Signed)
Patient scheduled to be here this afternoon

## 2021-10-11 NOTE — Patient Instructions (Signed)
Please complete lab work prior to leaving. Start nystatin cream twice daily for rash beneath breasts. Add gabapentin 100mg  TID prn.

## 2021-10-11 NOTE — Telephone Encounter (Signed)
Please advise pt that since pain is so severe, I think she should be evaluated either in our office today or at urgent care please.

## 2021-10-11 NOTE — Assessment & Plan Note (Signed)
BP Readings from Last 3 Encounters:  10/11/21 (!) 107/58  10/03/21 (!) 140/91  09/26/21 134/71   BP looks good today. Continue HCTZ 25mg  and Kdur 10 mEQ. Labs as ordered.

## 2021-10-11 NOTE — Assessment & Plan Note (Signed)
Improved. ? If secondary to advair or clindamycin.

## 2021-10-11 NOTE — Assessment & Plan Note (Signed)
New. Rx with nystatin.

## 2021-10-11 NOTE — Assessment & Plan Note (Addendum)
She does not meet criteria for FM diagnosis. It is possible that her symptoms are related to post- covid syndrome.

## 2021-10-11 NOTE — Progress Notes (Signed)
Subjective:   By signing my name below, I, Wanda Mays, attest that this documentation has been prepared under the direction and in the presence of Wanda Alar, NP, 10/11/2021   Patient ID: Wanda Mays, female    DOB: Mar 09, 1976, 45 y.o.   MRN: [id number]  Chief Complaint  Patient presents with   Generalized Body Aches    Complains of body aches "everywhere"   Breast Pain    Complains of pain on her breasts and under armpits.     HPI Patient is in today for an office visit.  Breast concern: During her last visit she complained of nipple drainage on the left breast and swelling in her left armpit. She notes now that it is in both breasts but began in the left. She describes the pain as a really terrible sunburn as well as deep tissue pain. She notes neck pain on the right side of her neck and notes that it sometimes can hurt to the point that she struggles to swallow. Arm pain: She notes that the pain in her arms is tender and sore to the touch.  Body aches: She mentions that she has been experiencing overall body ache in her and upper and lower extremities. She mentions that her hips and groin is the worst of her pain describing that it will not go away unless she massages it out.  Shortness of breath: She mentions that her shortness of breath was resolved with use of the antibiotics she prescribed during her last visit.  Medications: She has been using an Advair inhaler, 25 mg Antivert, 0.125 mg Levsin SL, 50 mg Cymbalta, 25 mg hydrochlorothiazide, and 10 mg potassium supplement. She is also compliant in taking her 100 mg trazodone for her sleep and 25 mg atarax for anxiety management.  Health Maintenance Due  Topic Date Due   COVID-19 Vaccine (1) Never done   HIV Screening  Never done   Hepatitis C Screening  Never done   TETANUS/TDAP  Never done   PAP SMEAR-Modifier  Never done   INFLUENZA VACCINE  Never done    Past Medical History:  Diagnosis Date   ADD  (attention deficit disorder)    Bipolar 1 disorder (HCC)    Endometriosis    GERD (gastroesophageal reflux disease)    Hiatal hernia    Hypercholesteremia    Hypertension    Lung nodule seen on imaging study    OCD (obsessive compulsive disorder)     Past Surgical History:  Procedure Laterality Date   ABDOMINAL HYSTERECTOMY     CESAREAN SECTION     CHOLECYSTECTOMY     COLONOSCOPY     around 2007 Wayne did have polyps   LAPAROSCOPIC ABDOMINAL EXPLORATION     UPPER GASTROINTESTINAL ENDOSCOPY      Family History  Problem Relation Age of Onset   Clotting disorder Mother        TIA's   Diabetes type II Mother    Hypertension Mother    Hypertension Father    Diabetes type II Father    Heart Problems Father    Hypertension Sister    Diabetes type II Sister    Heart murmur Brother    Asthma Brother    Atrial fibrillation Maternal Grandmother    Diabetes Mellitus I Paternal Grandfather    Heart Problems Paternal Grandfather    Lung cancer Paternal Grandfather    Asthma Daughter    ADD / ADHD Daughter    ADD / ADHD  Son    Asthma Son    Colon cancer Neg Hx    Rectal cancer Neg Hx    Stomach cancer Neg Hx     Social History   Socioeconomic History   Marital status: Married    Spouse name: Harrell Gave   Number of children: Not on file   Years of education: Not on file   Highest education level: Not on file  Occupational History   Not on file  Tobacco Use   Smoking status: Every Day    Packs/day: 1.00    Years: 1.00    Pack years: 1.00    Types: Cigarettes    Passive exposure: Current   Smokeless tobacco: Never  Vaping Use   Vaping Use: Never used  Substance and Sexual Activity   Alcohol use: Not Currently   Drug use: Not Currently   Sexual activity: Not Currently    Birth control/protection: Surgical    Comment: Partial hysterectomy  Other Topics Concern   Not on file  Social History Narrative   Patient is currently unemployed   CMA, has been  doing customer service   Married   5 children   [place]   2006 [place] (son)      2 children local and 3 in [place]      Enjoys crafts, playing with kids, plays piano   1 cat and 1 dog   Completed associates degree   Social Determinants of Radio broadcast assistant Strain: Not on file  Food Insecurity: Not on file  Transportation Needs: Not on file  Physical Activity: Inactive   Days of Exercise per Week: 0 days   Minutes of Exercise per Session: 0 min  Stress: Not on file  Social Connections: Not on file  Intimate Partner Violence: Not on file    Outpatient Medications Prior to Visit  Medication Sig Dispense Refill   acetaminophen-codeine (TYLENOL #3) 300-30 MG tablet Take 1 tablet by mouth every 6 (six) hours as needed for moderate pain. 20 tablet 0   albuterol (VENTOLIN HFA) 108 (90 Base) MCG/ACT inhaler Inhale 2 puffs into the lungs every 6 (six) hours as needed for wheezing or shortness of breath. 18 g 0   DULoxetine (CYMBALTA) 60 MG capsule Take 90 mg by mouth daily.     hyoscyamine (LEVSIN SL) 0.125 MG SL tablet Place 1 tablet (0.125 mg total) under the tongue every 6 (six) hours as needed for cramping. 30 tablet 3   ipratropium-albuterol (DUONEB) 0.5-2.5 (3) MG/3ML SOLN Take 3 mLs by nebulization every 4 (four) hours as needed. 72 mL 1   omeprazole (PRILOSEC) 40 MG capsule Take 1 capsule (40 mg total) by mouth 2 (two) times daily. Prilosec 40 mg twice daily for 6 weeks, then reduce to 40 mg daily and titrate to lowest effective dose to control reflux. 90 capsule 3   propranolol (INDERAL) 40 MG tablet Take 1 tablet (40 mg total) by mouth every 8 (eight) hours. 216 tablet 4   ADVAIR DISKUS 250-50 MCG/ACT AEPB Inhale 1 puff into the lungs 2 (two) times daily.     dicyclomine (BENTYL) 20 MG tablet Take 1 tablet (20 mg total) by mouth 2 (two) times daily. 20 tablet 0   diphenhydramine-acetaminophen (TYLENOL PM) 25-500 MG  TABS tablet Take 1 tablet by mouth at bedtime as needed (sleep). 14 tablet    hydrochlorothiazide (HYDRODIURIL) 25 MG tablet Take 25 mg  by mouth daily.     hydrOXYzine (ATARAX/VISTARIL) 25 MG tablet Take 1 tablet (25 mg total) by mouth 3 (three) times daily as needed for anxiety. 42 tablet 0   meclizine (ANTIVERT) 25 MG tablet Take 1 tablet (25 mg total) by mouth 3 (three) times daily as needed for dizziness. 30 tablet 0   metoCLOPramide (REGLAN) 10 MG tablet Take 1 tablet (10 mg total) by mouth every 8 (eight) hours as needed for nausea. 20 tablet 0   ondansetron (ZOFRAN ODT) 4 MG disintegrating tablet Dissolve 1 tablet (4 mg total) by mouth every 8 (eight) hours as needed for nausea or vomiting. 20 tablet 0   ondansetron (ZOFRAN) 4 MG tablet Take 1 tablet (4 mg total) by mouth every 8 (eight) hours as needed for nausea or vomiting. 15 tablet 0   potassium chloride (KLOR-CON) 10 MEQ tablet Take 10 mEq by mouth daily.     potassium chloride SA (KLOR-CON) 20 MEQ tablet Take 1 tablet (20 mEq total) by mouth daily. Take for 3 days. (Total of 20 mEq) 3 tablet 0   traZODone (DESYREL) 100 MG tablet Take 100-200 mg by mouth at bedtime as needed.     clindamycin (CLEOCIN) 300 MG capsule Take 1 capsule (300 mg total) by mouth 4 (four) times daily. 28 capsule 0   No facility-administered medications prior to visit.    Allergies  Allergen Reactions   Aspirin    Penicillins     Review of Systems  Constitutional:  Positive for malaise/fatigue.  Respiratory:  Negative for shortness of breath (resolved since last visit).   Musculoskeletal:  Positive for back pain, joint pain, myalgias and neck pain (So bad at times on the right side of her neck it becomes hard to swallow).       (+) generalized body aches (+) pain in groin and hips  (+) pain and tenderness in breasts  Skin:  Positive for rash (on breasts).  Psychiatric/Behavioral:  Positive for depression (history of depression).       Objective:     Physical Exam Constitutional:      General: She is not in acute distress.    Appearance: Normal appearance. She is not ill-appearing.     Comments: (+) palpable lymph node left axilla   HENT:     Head: Normocephalic and atraumatic.     Right Ear: External ear normal.     Left Ear: External ear normal.  Eyes:     Extraocular Movements: Extraocular movements intact.     Pupils: Pupils are equal, round, and reactive to light.  Cardiovascular:     Rate and Rhythm: Normal rate and regular rhythm.     Heart sounds: Normal heart sounds. No murmur heard.   No gallop.  Pulmonary:     Effort: Pulmonary effort is normal. No respiratory distress.     Breath sounds: Normal breath sounds. No wheezing or rales.  Musculoskeletal:        General: Tenderness (in shoulders, elbows, under buttocks, and hips) present.  Lymphadenopathy:     Cervical: No cervical adenopathy.  Skin:    General: Skin is warm and dry.     Findings: Rash (fungal rash between and under both breasts) present.  Neurological:     Mental Status: She is alert and oriented to person, place, and time.     Comments: 6/18 fibromyalgia trigger points were tender on exam  Psychiatric:        Behavior: Behavior normal.  Judgment: Judgment normal.    BP (!) 107/58 (BP Location: Right Arm, Patient Position: Sitting, Cuff Size: Large)   Pulse 79   Temp 98.6 F (37 C) (Oral)   Resp 16   Wt 215 lb (97.5 kg)   SpO2 98%   BMI 38.09 kg/m  Wt Readings from Last 3 Encounters:  10/11/21 215 lb (97.5 kg)  10/03/21 210 lb (95.3 kg)  09/26/21 218 lb (98.9 kg)       Assessment & Plan:   Problem List Items Addressed This Visit       Unprioritized   Shortness of breath    Improved. ? If secondary to advair or clindamycin.       Primary hypertension    BP Readings from Last 3 Encounters:  10/11/21 (!) 107/58  10/03/21 (!) 140/91  09/26/21 134/71  BP looks good today. Continue HCTZ 25mg  and Kdur 10 mEQ. Labs as  ordered.       Relevant Medications   hydrochlorothiazide (HYDRODIURIL) 25 MG tablet   Myalgia - Primary    She does not meet criteria for FM diagnosis.       Relevant Orders   CK   Mastalgia    I don't think her breast pain is due to infection.  I suspect more of a neuropathic type pain that she is experiencing in her arms, breasts, neck.  She is scheduled for diagnostic mammo and left breast US. Will add R breast US due to new axillary LAD.       Intertrigo    New. Rx with nystatin.       Relevant Medications   nystatin cream (MYCOSTATIN)   Axillary lymphadenopathy   Relevant Orders   US BREAST LTD UNI RIGHT INC AXILLA   Arthralgia   Relevant Orders   ANA   Sedimentation rate   Rheumatoid Factor   Other Visit Diagnoses     Nausea and vomiting       Relevant Medications   ondansetron (ZOFRAN) 4 MG tablet      Meds ordered this encounter  Medications   gabapentin (NEURONTIN) 100 MG capsule    Sig: Take 1 capsule (100 mg total) by mouth 3 (three) times daily.    Dispense:  90 capsule    Refill:  3    Order Specific Question:   Supervising Provider    Answer:   Penni Homans A [[id number]]   nystatin cream (MYCOSTATIN)    Sig: Apply 1 application topically 2 (two) times daily.    Dispense:  30 g    Refill:  0    Order Specific Question:   Supervising Provider    Answer:   Penni Homans A [[id number]]   ondansetron (ZOFRAN) 4 MG tablet    Sig: Take 1 tablet (4 mg total) by mouth every 8 (eight) hours as needed for nausea or vomiting.    Dispense:  30 tablet    Refill:  1    Order Specific Question:   Supervising Provider    Answer:   Penni Homans A [[id number]]   ADVAIR DISKUS 250-50 MCG/ACT AEPB    Sig: Inhale 1 puff into the lungs 2 (two) times daily.    Dispense:  60 each    Refill:  2    Order Specific Question:   Supervising Provider    Answer:   Penni Homans A [[id number]]   hydrochlorothiazide (HYDRODIURIL) 25 MG tablet    Sig: Take 1 tablet (25 mg total) by mouth  daily.  Dispense:  90 tablet    Refill:  1    Order Specific Question:   Supervising Provider    Answer:   Penni Homans A [[id number]]   hydrOXYzine (ATARAX/VISTARIL) 25 MG tablet    Sig: Take 1 tablet (25 mg total) by mouth 3 (three) times daily as needed for anxiety.    Dispense:  42 tablet    Refill:  0    Order Specific Question:   Supervising Provider    Answer:   Penni Homans A [[id number]]   meclizine (ANTIVERT) 25 MG tablet    Sig: Take 1 tablet (25 mg total) by mouth 3 (three) times daily as needed for dizziness.    Dispense:  30 tablet    Refill:  0    Order Specific Question:   Supervising Provider    Answer:   Penni Homans A [[id number]]   potassium chloride (KLOR-CON) 10 MEQ tablet    Sig: Take 1 tablet (10 mEq total) by mouth daily.    Dispense:  90 tablet    Refill:  0    Order Specific Question:   Supervising Provider    Answer:   Penni Homans A [[id number]]   traZODone (DESYREL) 100 MG tablet    Sig: Take 1-2 tablets (100-200 mg total) by mouth at bedtime as needed.    Dispense:  90 tablet    Refill:  0    Order Specific Question:   Supervising Provider    Answer:   Penni Homans A [[id number]]    I, Wanda Alar, NP, personally preformed the services described in this documentation.  All medical record entries made by the scribe were at my direction and in my presence.  I have reviewed the chart and discharge instructions (if applicable) and agree that the record reflects my personal performance and is accurate and complete. 10/11/2021  I,Wanda Mays,acting as a Education administrator for Nance Pear, NP.,have documented all relevant documentation on the behalf of Nance Pear, NP,as directed by  Nance Pear, NP while in the presence of Nance Pear, NP.  Nance Pear, NP

## 2021-10-14 ENCOUNTER — Ambulatory Visit (INDEPENDENT_AMBULATORY_CARE_PROVIDER_SITE_OTHER): Payer: 59 | Admitting: Pulmonary Disease

## 2021-10-14 ENCOUNTER — Encounter: Payer: Self-pay | Admitting: Pulmonary Disease

## 2021-10-14 ENCOUNTER — Other Ambulatory Visit: Payer: Self-pay

## 2021-10-14 ENCOUNTER — Ambulatory Visit: Payer: 59 | Admitting: Cardiology

## 2021-10-14 ENCOUNTER — Telehealth: Payer: Self-pay | Admitting: Family

## 2021-10-14 VITALS — BP 122/66 | HR 88 | Temp 98.1°F | Ht 63.0 in | Wt 218.0 lb

## 2021-10-14 DIAGNOSIS — R911 Solitary pulmonary nodule: Secondary | ICD-10-CM | POA: Diagnosis not present

## 2021-10-14 DIAGNOSIS — Z6838 Body mass index (BMI) 38.0-38.9, adult: Secondary | ICD-10-CM

## 2021-10-14 DIAGNOSIS — R768 Other specified abnormal immunological findings in serum: Secondary | ICD-10-CM

## 2021-10-14 DIAGNOSIS — F17200 Nicotine dependence, unspecified, uncomplicated: Secondary | ICD-10-CM

## 2021-10-14 LAB — ANTI-NUCLEAR AB-TITER (ANA TITER)
ANA TITER: 1:40 {titer} — ABNORMAL HIGH
ANA Titer 1: 1:80 {titer} — ABNORMAL HIGH

## 2021-10-14 LAB — ANA: Anti Nuclear Antibody (ANA): POSITIVE — AB

## 2021-10-14 LAB — RHEUMATOID FACTOR: Rheumatoid fact SerPl-aCnc: <14 IU/mL (ref <14)

## 2021-10-14 NOTE — Telephone Encounter (Signed)
See mychart.  

## 2021-10-14 NOTE — Progress Notes (Signed)
Synopsis: Referred in Oct 20222 for lung nodule by [name], NP  Subjective:   PATIENT ID: Wanda Mays GENDER: female DOB: 27-Apr-1976, MRN: [id number]  Chief Complaint  Patient presents with   Consult    Pt. Says hospital found a lung nodule    PMH of endometrosis, hiatal hernia, obesity and HTN. Current smoker for the past ~2 years, had quit for 20 years and restarted. Now smoking 1 ppd. Lung nodule was found incidentally. She was seen in the ED and had CT which was negative for PE.  Patient has had COVID several times this year.  Still feels like it is taking a long time for her to get over this.  She think she has had at least 3 times.   Past Medical History:  Diagnosis Date   ADD (attention deficit disorder)    Bipolar 1 disorder ([place])    Endometriosis    GERD (gastroesophageal reflux disease)    Hiatal hernia    Hypercholesteremia    Hypertension    Lung nodule seen on imaging study    OCD (obsessive compulsive disorder)      Family History  Problem Relation Age of Onset   Clotting disorder Mother        TIA's   Diabetes type II Mother    Hypertension Mother    Hypertension Father    Diabetes type II Father    Heart Problems Father    Hypertension Sister    Diabetes type II Sister    Heart murmur Brother    Asthma Brother    Atrial fibrillation Maternal Grandmother    Diabetes Mellitus I Paternal Grandfather    Heart Problems Paternal Grandfather    Lung cancer Paternal Grandfather    Asthma Daughter    ADD / ADHD Daughter    ADD / ADHD Son    Asthma Son    Colon cancer Neg Hx    Rectal cancer Neg Hx    Stomach cancer Neg Hx      Past Surgical History:  Procedure Laterality Date   ABDOMINAL HYSTERECTOMY     CESAREAN SECTION     CHOLECYSTECTOMY     COLONOSCOPY     around 2007 Frankey Poot did have polyps   LAPAROSCOPIC ABDOMINAL EXPLORATION     UPPER GASTROINTESTINAL ENDOSCOPY      Social History   Socioeconomic History   Marital  status: Married    Spouse name: Harrell Gave   Number of children: Not on file   Years of education: Not on file   Highest education level: Not on file  Occupational History   Not on file  Tobacco Use   Smoking status: Every Day    Packs/day: 1.00    Years: 1.00    Pack years: 1.00    Types: Cigarettes    Passive exposure: Current   Smokeless tobacco: Never  Vaping Use   Vaping Use: Never used  Substance and Sexual Activity   Alcohol use: Not Currently   Drug use: Not Currently   Sexual activity: Not Currently    Birth control/protection: Surgical    Comment: Partial hysterectomy  Other Topics Concern   Not on file  Social History Narrative   Patient is currently unemployed   CMA, has been doing customer service   Married   5 children   [place] (son)      2  children local and 3 in [place]      Enjoys crafts, playing with kids, plays piano   1 cat and 1 dog   Completed associates degree   Social Determinants of Health   Financial Resource Strain: Not on file  Food Insecurity: Not on file  Transportation Needs: Not on file  Physical Activity: Inactive   Days of Exercise per Week: 0 days   Minutes of Exercise per Session: 0 min  Stress: Not on file  Social Connections: Not on file  Intimate Partner Violence: Not on file     Allergies  Allergen Reactions   Aspirin    Penicillins      Outpatient Medications Prior to Visit  Medication Sig Dispense Refill   acetaminophen-codeine (TYLENOL #3) 300-30 MG tablet Take 1 tablet by mouth every 6 (six) hours as needed for moderate pain. 20 tablet 0   ADVAIR DISKUS 250-50 MCG/ACT AEPB Inhale 1 puff into the lungs 2 (two) times daily. 60 each 2   albuterol (VENTOLIN HFA) 108 (90 Base) MCG/ACT inhaler Inhale 2 puffs into the lungs every 6 (six) hours as needed for wheezing or shortness of breath. 18 g 0   DULoxetine (CYMBALTA) 60 MG capsule Take 90 mg by  mouth daily.     gabapentin (NEURONTIN) 100 MG capsule Take 1 capsule (100 mg total) by mouth 3 (three) times daily. 90 capsule 3   hydrochlorothiazide (HYDRODIURIL) 25 MG tablet Take 1 tablet (25 mg total) by mouth daily. 90 tablet 1   hydrOXYzine (ATARAX/VISTARIL) 25 MG tablet Take 1 tablet (25 mg total) by mouth 3 (three) times daily as needed for anxiety. 42 tablet 0   hyoscyamine (LEVSIN SL) 0.125 MG SL tablet Place 1 tablet (0.125 mg total) under the tongue every 6 (six) hours as needed for cramping. 30 tablet 3   ipratropium-albuterol (DUONEB) 0.5-2.5 (3) MG/3ML SOLN Take 3 mLs by nebulization every 4 (four) hours as needed. 72 mL 1   meclizine (ANTIVERT) 25 MG tablet Take 1 tablet (25 mg total) by mouth 3 (three) times daily as needed for dizziness. 30 tablet 0   nystatin cream (MYCOSTATIN) Apply 1 application topically 2 (two) times daily. 30 g 0   omeprazole (PRILOSEC) 40 MG capsule Take 1 capsule (40 mg total) by mouth 2 (two) times daily. Prilosec 40 mg twice daily for 6 weeks, then reduce to 40 mg daily and titrate to lowest effective dose to control reflux. 90 capsule 3   ondansetron (ZOFRAN) 4 MG tablet Take 1 tablet (4 mg total) by mouth every 8 (eight) hours as needed for nausea or vomiting. 30 tablet 1   potassium chloride (KLOR-CON) 10 MEQ tablet Take 1 tablet (10 mEq total) by mouth daily. 90 tablet 0   propranolol (INDERAL) 40 MG tablet Take 1 tablet (40 mg total) by mouth every 8 (eight) hours. 216 tablet 4   traZODone (DESYREL) 100 MG tablet Take 1-2 tablets (100-200 mg total) by mouth at bedtime as needed. 90 tablet 0   No facility-administered medications prior to visit.    Review of Systems  Constitutional:  Positive for malaise/fatigue. Negative for chills, fever and weight loss.  HENT:  Negative for hearing loss, sore throat and tinnitus.   Eyes:  Negative for blurred vision and double vision.  Respiratory:  Positive for shortness of breath. Negative for cough,  hemoptysis, sputum production, wheezing and stridor.   Cardiovascular:  Positive for leg swelling. Negative for chest pain, palpitations, orthopnea and PND.  Gastrointestinal:  Negative for abdominal pain, constipation, diarrhea, heartburn, nausea and vomiting.  Genitourinary:  Negative for dysuria, hematuria and urgency.  Musculoskeletal:  Positive for myalgias. Negative for joint pain.  Skin:  Negative for itching and rash.  Neurological:  Negative for dizziness, tingling, weakness and headaches.  Endo/Heme/Allergies:  Negative for environmental allergies. Does not bruise/bleed easily.  Psychiatric/Behavioral:  Negative for depression. The patient is nervous/anxious. The patient does not have insomnia.   All other systems reviewed and are negative.   Objective:  Physical Exam Vitals reviewed.  Constitutional:      General: She is not in acute distress.    Appearance: She is well-developed. She is obese.  HENT:     Head: Normocephalic and atraumatic.  Eyes:     General: No scleral icterus.    Conjunctiva/sclera: Conjunctivae normal.     Pupils: Pupils are equal, round, and reactive to light.  Neck:     Vascular: No JVD.     Trachea: No tracheal deviation.  Cardiovascular:     Rate and Rhythm: Normal rate and regular rhythm.     Heart sounds: Normal heart sounds. No murmur heard. Pulmonary:     Effort: Pulmonary effort is normal. No tachypnea, accessory muscle usage or respiratory distress.     Breath sounds: No stridor. No wheezing, rhonchi or rales.     Comments: Diminished breath sounds bilaterally Abdominal:     General: Bowel sounds are normal. There is no distension.     Palpations: Abdomen is soft.     Tenderness: There is no abdominal tenderness.  Musculoskeletal:        General: No tenderness.     Cervical back: Neck supple.  Lymphadenopathy:     Cervical: No cervical adenopathy.  Skin:    General: Skin is warm and dry.     Capillary Refill: Capillary refill  takes less than 2 seconds.     Findings: No rash.  Neurological:     Mental Status: She is alert and oriented to person, place, and time.  Psychiatric:        Behavior: Behavior normal.     Vitals:   10/14/21 1511  BP: 122/66  Pulse: 88  Temp: 98.1 F (36.7 C)  TempSrc: Oral  SpO2: 95%  Weight: 218 lb (98.9 kg)  Height: 5\' 3"  (1.6 m)   95% on RA BMI Readings from Last 3 Encounters:  10/14/21 38.62 kg/m  10/11/21 38.09 kg/m  10/03/21 37.20 kg/m   Wt Readings from Last 3 Encounters:  10/14/21 218 lb (98.9 kg)  10/11/21 215 lb (97.5 kg)  10/03/21 210 lb (95.3 kg)     CBC    Component Value Date/Time   WBC 7.9 10/03/2021 1107   RBC 4.92 10/03/2021 1107   HGB 14.4 10/03/2021 1107   HCT 42.2 10/03/2021 1107   PLT 306.0 10/03/2021 1107   MCV 85.8 10/03/2021 1107   MCH 29.8 09/11/2021 0947   MCHC 34.0 10/03/2021 1107   RDW 13.4 10/03/2021 1107   LYMPHSABS 2.2 10/03/2021 1107   MONOABS 0.5 10/03/2021 1107   EOSABS 0.1 10/03/2021 1107   BASOSABS 0.0 10/03/2021 1107     Chest Imaging: 09/13/2021 CTA chest: Left lower lobe subpleural 12 mm lung nodule. The patient's images have been independently reviewed by me.    Pulmonary Functions Testing Results: No flowsheet data found.  FeNO:   Pathology:   Echocardiogram:   Heart Catheterization:     Assessment & Plan:     ICD-10-CM  1. Lung nodule  R91.1 NM PET Image Initial (PI) Skull Base To Thigh (F-18 FDG)    2. Current smoker  F17.200     3. BMI 38.0-38.9,adult  Z68.38       Discussion:  This is a 45 year old female, morbidly obese, current smoker found to have an incidental lower lobe pulmonary nodule.  Plan: Nuclear medicine PET scan Counseled on smoking cessation Pending on PET scan will come up with next best steps. Follow-up with [place] in approximately 3 to 4 weeks after PET scan complete.    Current Outpatient Medications:    acetaminophen-codeine (TYLENOL #3) 300-30 MG tablet, Take 1  tablet by mouth every 6 (six) hours as needed for moderate pain., Disp: 20 tablet, Rfl: 0   ADVAIR DISKUS 250-50 MCG/ACT AEPB, Inhale 1 puff into the lungs 2 (two) times daily., Disp: 60 each, Rfl: 2   albuterol (VENTOLIN HFA) 108 (90 Base) MCG/ACT inhaler, Inhale 2 puffs into the lungs every 6 (six) hours as needed for wheezing or shortness of breath., Disp: 18 g, Rfl: 0   DULoxetine (CYMBALTA) 60 MG capsule, Take 90 mg by mouth daily., Disp: , Rfl:    gabapentin (NEURONTIN) 100 MG capsule, Take 1 capsule (100 mg total) by mouth 3 (three) times daily., Disp: 90 capsule, Rfl: 3   hydrochlorothiazide (HYDRODIURIL) 25 MG tablet, Take 1 tablet (25 mg total) by mouth daily., Disp: 90 tablet, Rfl: 1   hydrOXYzine (ATARAX/VISTARIL) 25 MG tablet, Take 1 tablet (25 mg total) by mouth 3 (three) times daily as needed for anxiety., Disp: 42 tablet, Rfl: 0   hyoscyamine (LEVSIN SL) 0.125 MG SL tablet, Place 1 tablet (0.125 mg total) under the tongue every 6 (six) hours as needed for cramping., Disp: 30 tablet, Rfl: 3   ipratropium-albuterol (DUONEB) 0.5-2.5 (3) MG/3ML SOLN, Take 3 mLs by nebulization every 4 (four) hours as needed., Disp: 72 mL, Rfl: 1   meclizine (ANTIVERT) 25 MG tablet, Take 1 tablet (25 mg total) by mouth 3 (three) times daily as needed for dizziness., Disp: 30 tablet, Rfl: 0   nystatin cream (MYCOSTATIN), Apply 1 application topically 2 (two) times daily., Disp: 30 g, Rfl: 0   omeprazole (PRILOSEC) 40 MG capsule, Take 1 capsule (40 mg total) by mouth 2 (two) times daily. Prilosec 40 mg twice daily for 6 weeks, then reduce to 40 mg daily and titrate to lowest effective dose to control reflux., Disp: 90 capsule, Rfl: 3   ondansetron (ZOFRAN) 4 MG tablet, Take 1 tablet (4 mg total) by mouth every 8 (eight) hours as needed for nausea or vomiting., Disp: 30 tablet, Rfl: 1   potassium chloride (KLOR-CON) 10 MEQ tablet, Take 1 tablet (10 mEq total) by mouth daily., Disp: 90 tablet, Rfl: 0    propranolol (INDERAL) 40 MG tablet, Take 1 tablet (40 mg total) by mouth every 8 (eight) hours., Disp: 216 tablet, Rfl: 4   traZODone (DESYREL) 100 MG tablet, Take 1-2 tablets (100-200 mg total) by mouth at bedtime as needed., Disp: 90 tablet, Rfl: 0   Garner Nash, DO [facility] 10/14/2021 3:37 PM

## 2021-10-14 NOTE — Patient Instructions (Addendum)
Thank you for visiting Dr. Valeta Harms at [facility]. Today we recommend the following:  Orders Placed This Encounter  Procedures   NM PET Image Initial (PI) Skull Base To Thigh (F-18 FDG)   Return in about 4 weeks (around 11/11/2021) for w/ Eric Form, NP. To discuss PET results.     Please do your part to reduce the spread of COVID-19.

## 2021-10-16 ENCOUNTER — Encounter: Payer: Self-pay | Admitting: Family

## 2021-10-17 ENCOUNTER — Other Ambulatory Visit: Payer: Self-pay

## 2021-10-17 ENCOUNTER — Other Ambulatory Visit (INDEPENDENT_AMBULATORY_CARE_PROVIDER_SITE_OTHER): Payer: 59

## 2021-10-17 DIAGNOSIS — R300 Dysuria: Secondary | ICD-10-CM

## 2021-10-17 LAB — POC URINALSYSI DIPSTICK (AUTOMATED)
Bilirubin, UA: NEGATIVE
Blood, UA: NEGATIVE
Glucose, UA: NEGATIVE
Ketones, UA: NEGATIVE
Nitrite, UA: NEGATIVE
Protein, UA: NEGATIVE
Spec Grav, UA: 1.01 (ref 1.010–1.025)
Urobilinogen, UA: 0.2 E.U./dL
pH, UA: 7.5 (ref 5.0–8.0)

## 2021-10-17 NOTE — Telephone Encounter (Signed)
Ok per pcp, patient scheduled to come in for ua and culture today

## 2021-10-18 ENCOUNTER — Ambulatory Visit
Admission: RE | Admit: 2021-10-18 | Discharge: 2021-10-18 | Disposition: A | Discharge status: Home / Self Care | Payer: 59 | Source: Ambulatory Visit | Attending: Family | Admitting: Family

## 2021-10-18 DIAGNOSIS — N6320 Unspecified lump in the left breast, unspecified quadrant: Secondary | ICD-10-CM

## 2021-10-18 LAB — URINE CULTURE
MICRO NUMBER:: 12541937
SPECIMEN QUALITY:: ADEQUATE

## 2021-10-19 ENCOUNTER — Other Ambulatory Visit: Payer: Self-pay | Admitting: Family

## 2021-10-20 ENCOUNTER — Encounter: Payer: Self-pay | Admitting: Family

## 2021-10-20 DIAGNOSIS — N6452 Nipple discharge: Secondary | ICD-10-CM

## 2021-10-20 NOTE — Telephone Encounter (Signed)
Can you please call [facility] and confirm that they will be placing the MRI breast order for patient then notify pt?

## 2021-10-25 ENCOUNTER — Other Ambulatory Visit: Payer: Self-pay

## 2021-10-25 ENCOUNTER — Encounter: Payer: Self-pay | Admitting: Family

## 2021-10-25 ENCOUNTER — Ambulatory Visit (INDEPENDENT_AMBULATORY_CARE_PROVIDER_SITE_OTHER): Payer: 59 | Admitting: Family

## 2021-10-25 DIAGNOSIS — M791 Myalgia, unspecified site: Secondary | ICD-10-CM

## 2021-10-25 DIAGNOSIS — R911 Solitary pulmonary nodule: Secondary | ICD-10-CM

## 2021-10-25 DIAGNOSIS — E0590 Thyrotoxicosis, unspecified without thyrotoxic crisis or storm: Secondary | ICD-10-CM | POA: Diagnosis not present

## 2021-10-25 DIAGNOSIS — K921 Melena: Secondary | ICD-10-CM

## 2021-10-25 DIAGNOSIS — R590 Localized enlarged lymph nodes: Secondary | ICD-10-CM

## 2021-10-25 DIAGNOSIS — F319 Bipolar disorder, unspecified: Secondary | ICD-10-CM

## 2021-10-25 MED ORDER — BUPROPION HCL ER (SR) 150 MG PO TB12: 150.0000 mg | ORAL_TABLET | Freq: Two times a day (BID) | ORAL | 1 refills | Status: DC

## 2021-10-25 NOTE — Assessment & Plan Note (Signed)
PET scheduled per Pulmonology.

## 2021-10-25 NOTE — Assessment & Plan Note (Signed)
Lab Results  Component Value Date   TSH 0.96 09/13/2021   TSH WNL, monitor.

## 2021-10-25 NOTE — Assessment & Plan Note (Signed)
Intermittent BRBPR, likely related to known hemorrhoids. Had otherwise unremarkable colonoscopy.

## 2021-10-25 NOTE — Assessment & Plan Note (Signed)
Also with ongoing c/o unilateral breast discharge.  Await Breast MRI which is scheduled.

## 2021-10-25 NOTE — Progress Notes (Signed)
Subjective:   By signing my name below, I, Wanda Mays, attest that this documentation has been prepared under the direction and in the presence of [name], NP, 10/25/2021   Patient ID: Wanda Mays, female    DOB: [birth year], 45 y.o.   MRN: [id number]  Chief Complaint  Patient presents with   ongoing abdominal discomfort    Since June   Diarrhea   Emesis    HPI Patient is in today for an office visit.  Nausea: She has been experiencing nausea for months and also notes diarrhea and blood in her stool that she describes as looking like menstrual blood. She was referred to and seen by a gastrology specialist. They performed a colonoscopy and the results were normal but did find some hemorrhoids. She is still complaining of this nausea, diarrhea and blood in stool during this visit and also notes that she sometimes experiences vomiting with these symptoms.  Muscle pain: She had been experiencing muscle pain so blood work was performed at one of her previous visits. Her AMA levels came back elevated. She is scheduled to see a rheumatologist in December of this year.  Breast discharge: She was experiencing discharge out of her nipples and complaining of lumps. A physical exam was performed in her previous visit and there were no lumps examined but she does have a PET scan scheduled within the next month.  Depression and anxiety: She notes that her depression and anxiety is severe right now. She complains of not being able to get out of bed. She tried getting in with psychiatry but they are unable to see her until the spring. She is currently taking 60 mg Cymbalta to help with her symptoms but notes a recent increase in severity of depressive episodes and anxiety levels. She notes that she does feel suicidal at times but is not in a place where she has gone as far as making a plan.  Insomnia: She complains of insomnia. She uses 100 mg trazodone to help her sleep but notes that it  does not help anymore. Tobacco: She is currently still smoking but notes she is down to half a pack a day because she is trying to quit.    Health Maintenance Due  Topic Date Due   COVID-19 Vaccine (1) Never done   Pneumococcal Vaccine 2-79 Years old (1 - PCV) Never done   HIV Screening  Never done   Hepatitis C Screening  Never done   TETANUS/TDAP  Never done   PAP SMEAR-Modifier  Never done   INFLUENZA VACCINE  Never done    Past Medical History:  Diagnosis Date   ADD (attention deficit disorder)    Bipolar 1 disorder ([facility])    Endometriosis    GERD (gastroesophageal reflux disease)    Hiatal hernia    Hypercholesteremia    Hypertension    Lung nodule seen on imaging study    OCD (obsessive compulsive disorder)     Past Surgical History:  Procedure Laterality Date   ABDOMINAL HYSTERECTOMY     CESAREAN SECTION     CHOLECYSTECTOMY     COLONOSCOPY     around 2007 Tressie Ellis Flordia did have polyps   LAPAROSCOPIC ABDOMINAL EXPLORATION     UPPER GASTROINTESTINAL ENDOSCOPY      Family History  Problem Relation Age of Onset   Clotting disorder Mother        TIA's   Diabetes type II Mother    Hypertension Mother    Hypertension  Father    Diabetes type II Father    Heart Problems Father    Hypertension Sister    Diabetes type II Sister    Heart murmur Brother    Asthma Brother    Atrial fibrillation Maternal Grandmother    Diabetes Mellitus I Paternal Grandfather    Heart Problems Paternal Grandfather    Lung cancer Paternal Grandfather    Asthma Daughter    ADD / ADHD Daughter    ADD / ADHD Son    Asthma Son    Colon cancer Neg Hx    Rectal cancer Neg Hx    Stomach cancer Neg Hx     Social History   Socioeconomic History   Marital status: Married    Spouse name: Harrell Gave   Number of children: Not on file   Years of education: Not on file   Highest education level: Not on file  Occupational History   Not on file  Tobacco Use   Smoking status: Every  Day    Packs/day: 1.00    Years: 1.00    Pack years: 1.00    Types: Cigarettes    Passive exposure: Current   Smokeless tobacco: Never  Vaping Use   Vaping Use: Never used  Substance and Sexual Activity   Alcohol use: Not Currently   Drug use: Not Currently   Sexual activity: Not Currently    Birth control/protection: Surgical    Comment: Partial hysterectomy  Other Topics Concern   Not on file  Social History Narrative   Patient is currently unemployed   CMA, has been doing customer service   Married   5 children   [place]   2006 [place] (son)      2 children local and 3 in [place]      Enjoys crafts, playing with kids, plays piano   1 cat and 1 dog   Completed associates degree   Social Determinants of Radio broadcast assistant Strain: Not on file  Food Insecurity: Not on file  Transportation Needs: Not on file  Physical Activity: Inactive   Days of Exercise per Week: 0 days   Minutes of Exercise per Session: 0 min  Stress: Not on file  Social Connections: Not on file  Intimate Partner Violence: Not on file    Outpatient Medications Prior to Visit  Medication Sig Dispense Refill   ADVAIR DISKUS 250-50 MCG/ACT AEPB Inhale 1 puff into the lungs 2 (two) times daily. 60 each 2   albuterol (VENTOLIN HFA) 108 (90 Base) MCG/ACT inhaler Inhale 2 puffs into the lungs every 6 (six) hours as needed for wheezing or shortness of breath. 18 g 0   DULoxetine (CYMBALTA) 60 MG capsule Take 90 mg by mouth daily.     gabapentin (NEURONTIN) 100 MG capsule Take 1 capsule (100 mg total) by mouth 3 (three) times daily. 90 capsule 3   hydrOXYzine (ATARAX/VISTARIL) 25 MG tablet Take 1 tablet (25 mg total) by mouth 3 (three) times daily as needed for anxiety. 42 tablet 0   ipratropium-albuterol (DUONEB) 0.5-2.5 (3) MG/3ML SOLN Take 3 mLs by nebulization every 4 (four) hours as needed. 72 mL 1   meclizine (ANTIVERT) 25 MG tablet Take 1  tablet (25 mg total) by mouth 3 (three) times daily as needed for dizziness. 30 tablet 0   nystatin cream (MYCOSTATIN) Apply 1 application topically 2 (two) times daily. 30 g 0  omeprazole (PRILOSEC) 40 MG capsule Take 1 capsule (40 mg total) by mouth 2 (two) times daily. Prilosec 40 mg twice daily for 6 weeks, then reduce to 40 mg daily and titrate to lowest effective dose to control reflux. 90 capsule 3   ondansetron (ZOFRAN) 4 MG tablet Take 1 tablet (4 mg total) by mouth every 8 (eight) hours as needed for nausea or vomiting. 30 tablet 1   potassium chloride (KLOR-CON) 10 MEQ tablet Take 1 tablet (10 mEq total) by mouth daily. 90 tablet 0   propranolol (INDERAL) 40 MG tablet Take 1 tablet (40 mg total) by mouth every 8 (eight) hours. 216 tablet 4   traZODone (DESYREL) 100 MG tablet Take 1-2 tablets (100-200 mg total) by mouth at bedtime as needed. 90 tablet 0   acetaminophen-codeine (TYLENOL #3) 300-30 MG tablet Take 1 tablet by mouth every 6 (six) hours as needed for moderate pain. 20 tablet 0   hydrochlorothiazide (HYDRODIURIL) 25 MG tablet Take 1 tablet (25 mg total) by mouth daily. 90 tablet 1   hyoscyamine (LEVSIN SL) 0.125 MG SL tablet Place 1 tablet (0.125 mg total) under the tongue every 6 (six) hours as needed for cramping. 30 tablet 3   No facility-administered medications prior to visit.    Allergies  Allergen Reactions   Aspirin    Penicillins     Review of Systems  Constitutional:        (+) breast discharge  Gastrointestinal:  Positive for blood in stool, diarrhea, nausea and vomiting.  Musculoskeletal:  Positive for back pain and myalgias.  Psychiatric/Behavioral:  Positive for depression and suicidal ideas. The patient is nervous/anxious and has insomnia.       Objective:    Physical Exam Constitutional:      General: She is not in acute distress.    Appearance: Normal appearance. She is not ill-appearing.  HENT:     Head: Normocephalic and atraumatic.      Right Ear: Tympanic membrane, ear canal and external ear normal.     Left Ear: Tympanic membrane, ear canal and external ear normal.  Eyes:     Extraocular Movements: Extraocular movements intact.     Pupils: Pupils are equal, round, and reactive to light.  Skin:    General: Skin is warm and dry.  Neurological:     Mental Status: She is alert and oriented to person, place, and time.  Psychiatric:        Behavior: Behavior normal.        Judgment: Judgment normal.    BP 118/77   Pulse 84   Temp 98.1 F (36.7 C) (Oral)   Ht 5\' 3"  (1.6 m)   Wt 214 lb (97.1 kg)   SpO2 96%   BMI 37.91 kg/m  Wt Readings from Last 3 Encounters:  10/25/21 214 lb (97.1 kg)  10/14/21 218 lb (98.9 kg)  10/11/21 215 lb (97.5 kg)       Assessment & Plan:   Problem List Items Addressed This Visit       Unprioritized   Myalgia    ? If related to positive ANA, await rheumatology evaluation.       Lung nodule    PET scheduled per Pulmonology.       Hyperthyroidism    Lab Results  Component Value Date   TSH 0.96 09/13/2021  TSH WNL, monitor.       Hematochezia    Intermittent BRBPR, likely related to known hemorrhoids. Had otherwise unremarkable colonoscopy.  Bipolar 1 disorder ([place])    Depression remains uncontrolled on cymbalta 90mg  once daily. Will add wellbutrin. This will hopefully help with her depression as well as smoking cessation.       Axillary lymphadenopathy    Also with ongoing c/o unilateral breast discharge.  Await Breast MRI which is scheduled.       Meds ordered this encounter  Medications   buPROPion (WELLBUTRIN SR) 150 MG 12 hr tablet    Sig: Take 1 tablet (150 mg total) by mouth 2 (two) times daily.    Dispense:  60 tablet    Refill:  1    Order Specific Question:   Supervising Provider    Answer:   Penni Homans A [[id number]]   I, [name], NP, personally preformed the services described in this documentation.  All medical record entries made by  the scribe were at my direction and in my presence.  I have reviewed the chart and discharge instructions (if applicable) and agree that the record reflects my personal performance and is accurate and complete. 10/25/2021  I,Wanda Mays,acting as a Education administrator for Nance Pear, NP.,have documented all relevant documentation on the behalf of Nance Pear, NP,as directed by  Nance Pear, NP while in the presence of Nance Pear, NP.  Nance Pear, NP

## 2021-10-25 NOTE — Assessment & Plan Note (Signed)
Depression remains uncontrolled on cymbalta 90mg  once daily. Will add wellbutrin. This will hopefully help with her depression as well as smoking cessation.

## 2021-10-25 NOTE — Assessment & Plan Note (Signed)
?   If related to positive ANA, await rheumatology evaluation.

## 2021-10-27 ENCOUNTER — Encounter (HOSPITAL_COMMUNITY)
Admission: RE | Admit: 2021-10-27 | Discharge: 2021-10-27 | Disposition: A | Discharge status: Home / Self Care | Payer: 59 | Source: Ambulatory Visit | Attending: Pulmonary Disease | Admitting: Pulmonary Disease

## 2021-10-27 DIAGNOSIS — R911 Solitary pulmonary nodule: Secondary | ICD-10-CM | POA: Diagnosis not present

## 2021-10-27 LAB — GLUCOSE, CAPILLARY: Glucose-Capillary: 121 mg/dL — ABNORMAL HIGH (ref 70–99)

## 2021-10-27 MED ORDER — FLUDEOXYGLUCOSE F - 18 (FDG) INJECTION
10.5000 | Freq: Once | INTRAVENOUS | Status: AC
  Administered 2021-10-27: 10.5 via INTRAVENOUS

## 2021-10-28 ENCOUNTER — Encounter: Payer: Self-pay | Admitting: Family

## 2021-10-28 MED ORDER — METHOCARBAMOL 500 MG PO TABS: 500.0000 mg | ORAL_TABLET | Freq: Three times a day (TID) | ORAL | 0 refills | Status: DC | PRN

## 2021-10-31 ENCOUNTER — Ambulatory Visit: Payer: 59 | Admitting: Family

## 2021-10-31 ENCOUNTER — Ambulatory Visit (INDEPENDENT_AMBULATORY_CARE_PROVIDER_SITE_OTHER): Payer: 59 | Admitting: Obstetrics and Gynecology

## 2021-10-31 ENCOUNTER — Encounter: Payer: Self-pay | Admitting: Obstetrics and Gynecology

## 2021-10-31 ENCOUNTER — Other Ambulatory Visit: Payer: Self-pay

## 2021-10-31 VITALS — BP 111/78 | HR 87 | Ht 63.0 in | Wt 208.0 lb

## 2021-10-31 DIAGNOSIS — N809 Endometriosis, unspecified: Secondary | ICD-10-CM

## 2021-10-31 MED ORDER — ORILISSA 150 MG PO TABS: 1.0000 | ORAL_TABLET | Freq: Every day | ORAL | 6 refills | Status: DC

## 2021-10-31 NOTE — Telephone Encounter (Signed)
Results discussed with pt by [name], NP

## 2021-10-31 NOTE — Progress Notes (Signed)
45 yo P5 presenting today for the evaluation of endometriosis. Patient reports having a hysterectomy 10 years ago secondary to endometriosis. She states that she experiences [place] pelvic pain. She states that she has nodules in her lungs and breast and PCP is concerned that they are related to endometriosis. She is scheduled for a breast biopsy and MRI in the coming weeks. Patient states her pain is localized to her pelvis. She cannot truly identified a cyclic nature to her pain other than it is also always present  Past Medical History:  Diagnosis Date   ADD (attention deficit disorder)    Anemia    Arthritis    Asthma    Bipolar 1 disorder (HCC)    Dyspnea    Endometriosis    Fibroid    GERD (gastroesophageal reflux disease)    Hiatal hernia    Hypercholesteremia    Hypertension    Hyperthyroidism    Lung nodule seen on imaging study    OCD (obsessive compulsive disorder)    Pneumonia    Past Surgical History:  Procedure Laterality Date   ABDOMINAL HYSTERECTOMY     CESAREAN SECTION     CHOLECYSTECTOMY     COLONOSCOPY     around 2007 [place] did have polyps   LAPAROSCOPIC ABDOMINAL EXPLORATION     UPPER GASTROINTESTINAL ENDOSCOPY     Family History  Problem Relation Age of Onset   Clotting disorder Mother        TIA's   Diabetes type II Mother    Hypertension Mother    Hypertension Father    Diabetes type II Father    Heart Problems Father    Hypertension Sister    Diabetes type II Sister    Heart murmur Brother    Asthma Brother    Asthma Daughter    ADD / ADHD Daughter    ADD / ADHD Son    Asthma Son    Cancer Paternal Uncle    Atrial fibrillation Maternal Grandmother    Cancer Maternal Grandfather    Cancer Paternal Grandfather    Diabetes Mellitus I Paternal Grandfather    Heart Problems Paternal Grandfather    Lung cancer Paternal Grandfather    Colon cancer Neg Hx    Rectal cancer Neg Hx    Stomach cancer Neg Hx    Social History   Tobacco  Use   Smoking status: Every Day    Packs/day: 1.00    Years: 1.00    Pack years: 1.00    Types: Cigarettes    Passive exposure: Current   Smokeless tobacco: Never  Vaping Use   Vaping Use: Never used  Substance Use Topics   Alcohol use: Not Currently   Drug use: Not Currently   ROS See pertinent in HPI. All other systems reviewed and non contributory  Blood pressure 111/78, pulse 87, height 5\' 3"  (1.6 m), weight 208 lb (94.3 kg). GENERAL: Well-developed, well-nourished female in no acute distress.  NEURO: alert and oriented x 3  A/P 45 yo with history of endometriosis and Ariaunna Longsworth pelvic pain - Rx Freida Busman provided for the management of pain related to endometriosis. If no improvement in her pain, will continue to explore other etiologies such as interstitial cystitis.  - Patient had a normal colonoscopy  - Follow up as scheduled for breast biopsy - RTC prn

## 2021-10-31 NOTE — Progress Notes (Addendum)
NEW GYN referral to discuss endometriosis

## 2021-11-01 ENCOUNTER — Encounter: Payer: Self-pay | Admitting: Family

## 2021-11-01 ENCOUNTER — Ambulatory Visit: Payer: 59 | Admitting: Family

## 2021-11-01 NOTE — Telephone Encounter (Signed)
Patient reports she is "much better, she thinks it was a panic attack. She is still having some SOB and racing HR on and off". Advised per provider to monitor symptoms and if they get worse to seek in person evaluation at ED

## 2021-11-02 ENCOUNTER — Other Ambulatory Visit: Payer: Self-pay | Admitting: *Deleted

## 2021-11-02 DIAGNOSIS — R911 Solitary pulmonary nodule: Secondary | ICD-10-CM

## 2021-11-04 NOTE — Progress Notes (Signed)
I have reviewed images. And nodule is not hypermetabolic but we need to follow it  Please schedule follow up CT Chest WO in 3 months and an appt with me or SG to review afterwards.   Thanks,  BLI  Garner Nash, DO [facility] 11/04/2021 4:42 PM

## 2021-11-08 ENCOUNTER — Other Ambulatory Visit: Payer: Self-pay | Admitting: Family

## 2021-11-08 MED ORDER — METHOCARBAMOL 500 MG PO TABS: 500.0000 mg | ORAL_TABLET | Freq: Three times a day (TID) | ORAL | 0 refills | Status: DC | PRN

## 2021-11-08 MED ORDER — MECLIZINE HCL 25 MG PO TABS: 25.0000 mg | ORAL_TABLET | Freq: Three times a day (TID) | ORAL | 0 refills | Status: DC | PRN

## 2021-11-09 ENCOUNTER — Encounter: Payer: Self-pay | Admitting: *Deleted

## 2021-11-09 ENCOUNTER — Encounter: Payer: Self-pay | Admitting: Pulmonary Disease

## 2021-11-09 ENCOUNTER — Encounter: Payer: Self-pay | Admitting: Family

## 2021-11-09 ENCOUNTER — Other Ambulatory Visit: Payer: Self-pay | Admitting: *Deleted

## 2021-11-09 DIAGNOSIS — R911 Solitary pulmonary nodule: Secondary | ICD-10-CM

## 2021-11-09 NOTE — Telephone Encounter (Signed)
PCC's the CT chest that is ordered for next week should be scheduled in 3 months.  Can we fix that please and thank you.

## 2021-11-09 NOTE — Telephone Encounter (Signed)
BI please advise. Thanks  

## 2021-11-09 NOTE — Progress Notes (Signed)
Order placed

## 2021-11-09 NOTE — Telephone Encounter (Signed)
Called and spoke with patient due to the nature of her MyChart message.   She stated that her cough developed last Thursday mid day. At times she is coughing so hard that she tends to vomit. She has not noticed a color to the phlegm she is coughing up. Denied any fevers but she has been more cold than usual. She has not been around anyone who has been sick recently. She had not tried any OTC medications because she is not sure what she can take since she is ANA positive. She has been wheezing as well. Slight increase in SOB.   Pharmacy is [facility] on [address], can you please advise? Thanks!

## 2021-11-09 NOTE — Telephone Encounter (Signed)
Her covid test came back negative.

## 2021-11-09 NOTE — Telephone Encounter (Signed)
Called patient, she stated that she has a few covid tests at home and will take a covid test. She will send a MyChart message with her results.

## 2021-11-10 ENCOUNTER — Ambulatory Visit
Admission: RE | Admit: 2021-11-10 | Discharge: 2021-11-10 | Disposition: A | Discharge status: Home / Self Care | Payer: 59 | Source: Ambulatory Visit | Attending: Family | Admitting: Family

## 2021-11-10 ENCOUNTER — Other Ambulatory Visit: Payer: Self-pay

## 2021-11-10 DIAGNOSIS — N6452 Nipple discharge: Secondary | ICD-10-CM

## 2021-11-10 MED ORDER — GADOBUTROL 1 MMOL/ML IV SOLN
10.0000 mL | Freq: Once | INTRAVENOUS | Status: AC | PRN
  Administered 2021-11-10: 15:00:00 10 mL via INTRAVENOUS

## 2021-11-10 NOTE — Telephone Encounter (Signed)
BI please advise. thanks  

## 2021-11-11 ENCOUNTER — Encounter: Payer: Self-pay | Admitting: Family

## 2021-11-12 ENCOUNTER — Other Ambulatory Visit: Payer: Self-pay | Admitting: Gastroenterology

## 2021-11-14 ENCOUNTER — Ambulatory Visit: Payer: 59 | Admitting: Acute Care

## 2021-11-14 ENCOUNTER — Other Ambulatory Visit: Payer: Self-pay | Admitting: General Surgery

## 2021-11-15 ENCOUNTER — Other Ambulatory Visit: Payer: Self-pay | Admitting: Family

## 2021-11-15 DIAGNOSIS — R9389 Abnormal findings on diagnostic imaging of other specified body structures: Secondary | ICD-10-CM

## 2021-11-15 NOTE — Progress Notes (Signed)
Office Visit Note  Patient: Wanda Mays             Date of Birth: [birth year]           MRN: [id number]             PCP: [name], NP Referring: [name], NP Visit Date: 11/16/2021  Subjective:  New Patient (Initial Visit) (Abnormal labs)   History of Present Illness: Wanda Mays is a 45 y.o. female here for evaluation of multiple symptoms including joint and muscle pains, fatigue, nausea and vomiting, and lymphadenopathy and positive ANA.  She has had chronic pain to some extent ever since multiple levels back injury since around 2011.  This did not require any major interventions was not on long-term medication for symptoms.  Her physical activity has been very low since this increases back pain symptoms.  Since at least around 2014 has had GI issues with some hematochezia frequently having nausea vomiting and food intolerance.  Evaluation with gastroenterology has never identified any clear problem outside of GERD. All of her problems are dramatically increased more recently she had a 3 rounds of COVID infection most recently the summer and subsequently doing worse.  She has pain daily especially around the shoulders back and hips not particularly associated with level of activity position or anything else.  Some good days and some bad days more often bad.  GI symptoms also doing much worse having frequent vomiting and describes being unable to eat solid foods for up to 5 days at a time.  She has developed vertigo symptoms improved after starting meclizine treatment.  No measured or reported orthostatic hypotension no syncope.  She has had some skin rashes with itchy goosebumps appearing changes she describes on her arms and face.  Episodic discoloration in hands with pallor in her fingers she reports after they are hanging in a downward position for a long time. More recently also had CT chest imaging due to chest pains and elevated D-dimer that demonstrated 12 mm lung  nodule.  Subsequent PET FDG scan was negative for metabolic enhancement of this nodule.  She also had axillary adenopathy on breast imaging has been scheduled for biopsy in a week. Several medications have been tried for symptom treatment with this including gabapentin, methocarbamol, and cymbalta. She has major depression recently worsened as well now on wellbutrin.  Labs reviewed 09/2021 ANA 1:80 nucleolar 1:40 homogenous RF neg ESR 15  Activities of Daily Living:  Patient reports morning stiffness for 24 hours.   Patient Reports nocturnal pain.  Difficulty dressing/grooming: Reports Difficulty climbing stairs: Reports Difficulty getting out of chair: Reports Difficulty using hands for taps, buttons, cutlery, and/or writing: Reports  Review of Systems  Constitutional:  Positive for fatigue.  HENT:  Positive for mouth dryness.   Eyes:  Positive for dryness.  Respiratory:  Positive for shortness of breath.   Cardiovascular:  Positive for swelling in legs/feet.  Gastrointestinal:  Positive for constipation and diarrhea.  Endocrine: Positive for cold intolerance, heat intolerance, excessive thirst and increased urination.  Genitourinary:  Positive for painful urination.  Musculoskeletal:  Positive for joint pain, gait problem, joint pain, joint swelling, muscle weakness, morning stiffness and muscle tenderness.  Skin:  Positive for rash.  Allergic/Immunologic: Negative for susceptible to infections.  Neurological:  Positive for weakness.  Hematological:  Positive for bruising/bleeding tendency.  Psychiatric/Behavioral:  Positive for sleep disturbance.    PMFS History:  Patient Active Problem List   Diagnosis Date Noted  Endometriosis determined by laparoscopy 11/16/2021   Positive ANA (antinuclear antibody) 11/16/2021   Intertrigo 10/11/2021   Axillary lymphadenopathy 10/11/2021   Arthralgia 10/11/2021   Myalgia 10/11/2021   Primary hypertension 10/11/2021   Mastalgia  10/03/2021   Dizziness 10/03/2021   Hematochezia 09/15/2021   Hyperthyroidism 09/15/2021   Chest pain 09/15/2021   Lung nodule 09/14/2021   Bipolar 1 disorder ([place]) 08/25/2021   GERD (gastroesophageal reflux disease) 08/25/2021   Hypercholesteremia 08/25/2021   Degenerative disc disease, cervical 08/08/2021   Degenerative disc disease, lumbar 10/23/2012   Vaginal discharge 10/15/2011   Yeast vaginitis 10/15/2011   Bronchitis 04/21/2011    Past Medical History:  Diagnosis Date   ADD (attention deficit disorder)    Anemia    Arthritis    Asthma    Bipolar 1 disorder (HCC)    DDD (degenerative disc disease), lumbar    DDD (degenerative disc disease), thoracic    Dyspnea    Endometriosis    Fibroid    GERD (gastroesophageal reflux disease)    Hiatal hernia    Hypercholesteremia    Hypertension    Hyperthyroidism    Lung nodule seen on imaging study    OCD (obsessive compulsive disorder)    Pneumonia     Family History  Problem Relation Age of Onset   Clotting disorder Mother        TIA's   Diabetes type II Mother    Hypertension Mother    Hypertension Father    Diabetes type II Father    Heart Problems Father    Hypertension Sister    Diabetes type II Sister    Heart murmur Brother    Asthma Brother    Asthma Daughter    ADD / ADHD Daughter    ADD / ADHD Son    Asthma Son    Cancer Paternal Uncle    Atrial fibrillation Maternal Grandmother    Cancer Maternal Grandfather    Cancer Paternal Grandfather    Diabetes Mellitus I Paternal Grandfather    Heart Problems Paternal Grandfather    Lung cancer Paternal Grandfather    Colon cancer Neg Hx    Rectal cancer Neg Hx    Stomach cancer Neg Hx    Past Surgical History:  Procedure Laterality Date   ABDOMINAL HYSTERECTOMY     CESAREAN SECTION     CHOLECYSTECTOMY     COLONOSCOPY     around 2007 [place] did have polyps   LAPAROSCOPIC ABDOMINAL EXPLORATION     UPPER GASTROINTESTINAL ENDOSCOPY      Social History   Social History Narrative   Patient is currently unemployed   CMA, has been doing customer service   Married   5 children   [place]   2006 [place]   2010- Dellie Catholic (son)      2 children local and 3 in [place]      Enjoys crafts, playing with kids, plays piano   1 cat and 1 dog   Completed associates degree    There is no immunization history on file for this patient.   Objective: Vital Signs: BP 112/78 (BP Location: Right Arm, Patient Position: Sitting, Cuff Size: Normal)   Pulse 73   Resp 15   Ht 5' 3"  (1.6 m)   Wt 209 lb (94.8 kg)   BMI 37.02 kg/m    Physical Exam Constitutional:      Appearance: She is obese.  HENT:  Right Ear: External ear normal.     Left Ear: External ear normal.     Mouth/Throat:     Mouth: Mucous membranes are moist.     Pharynx: Oropharynx is clear.  Eyes:     Conjunctiva/sclera: Conjunctivae normal.  Cardiovascular:     Rate and Rhythm: Normal rate and regular rhythm.  Pulmonary:     Effort: Pulmonary effort is normal.     Breath sounds: Normal breath sounds.  Musculoskeletal:     Right lower leg: No edema.     Left lower leg: No edema.  Lymphadenopathy:     Cervical: No cervical adenopathy.  Skin:    General: Skin is warm and dry.     Findings: No rash.     Comments: Normal nailfold capillaroscopy appearance  Neurological:     General: No focal deficit present.     Mental Status: She is alert.     Deep Tendon Reflexes: Reflexes normal.  Psychiatric:        Mood and Affect: Mood normal.     Musculoskeletal Exam:  Neck full ROM no tenderness Shoulders full ROM no tenderness or swelling Elbows full ROM no tenderness or swelling Wrists full ROM no tenderness or swelling Fingers full ROM no tenderness or swelling No paraspinal tenderness to palpation over upper and lower back Right hip posterior pain provoked with FABER movement otherwise normal ROM Knees full ROM no  tenderness or swelling Ankles full ROM no tenderness or swelling   Investigation: No additional findings.  Imaging: MR BREAST BILATERAL W WO CONTRAST INC CAD  Result Date: 11/11/2021 CLINICAL DATA:  History of left bloody nipple discharge with definite cause identified on recent mammographic/sonographic workup. EXAM: BILATERAL BREAST MRI WITH AND WITHOUT CONTRAST TECHNIQUE: Multiplanar, multisequence MR images of both breasts were obtained prior to and following the intravenous administration of 10 ml of Gadavist. Three-dimensional MR images were rendered by post-processing of the original MR data on an independent workstation. The three-dimensional MR images were interpreted, and findings are reported in the following complete MRI report for this study. Three dimensional images were evaluated at the independent interpreting workstation using the DynaCAD thin client. COMPARISON:  Previous exam(s). FINDINGS: Breast composition: c. Heterogeneous fibroglandular tissue. Background parenchymal enhancement: Moderate. Right breast: No suspicious mass or abnormal enhancement. Several small cysts are present. Left breast: Several small scattered cysts are present. 1.2 cm probable complicated cyst left retroareolar region. No suspicious masses or enhancement in the direct central retroareolar region. There is an oval subcentimeter indeterminate enhancing mass over the anterior third of the upper inner quadrant measuring 6 x 9 x 6 mm (series 9, image 68). There are 2 immediately adjacent oval enhancing masses over the middle third of the upper-outer quadrant measuring 0.8 x 1.4 x 1.1 cm when measured together (series 9, image 53). Focal ill-defined non mass enhancement over the posterior third of the upper-outer quadrant measuring 1 x 1.3 x 0.6 cm (series 9, image 59). Lymph nodes: No abnormal appearing lymph nodes. Ancillary findings:  None. IMPRESSION: 1. Indeterminate 9 mm mass over the anterior third of the left  upper inner quadrant. 2. Two immediately adjacent enhancing masses over the middle third left upper outer quadrant measuring 1.4 cm together. 3. Indeterminate non mass enhancement over the posterior third left upper outer quadrant measuring 1.3 cm. 4.  Multiple small bilateral breast cysts. RECOMMENDATION: 1. Recommend MRI guided biopsy of the indeterminate 9 mm mass over the anterior third of the left upper inner quadrant  in this patient with bloody nipple discharge. 2. Recommend MRI guided biopsy the indeterminate abnormality over the middle third of the left upper outer quadrant measuring 1.4 cm. Also recommend MR biopsy of the non mass enhancement over the posterior third of the left upper outer quadrant measuring 1.3 cm. Recommend performing these 2 biopsies on a separate day. BI-RADS CATEGORY  4: Suspicious. Electronically Signed   By: Marin Olp M.D.   On: 11/11/2021 11:48   NM PET Image Initial (PI) Skull Base To Thigh (F-18 FDG)  Result Date: 10/27/2021 CLINICAL DATA:  Initial treatment strategy for left lower lobe pulmonary nodule on CT. Recent chest pain and shortness of breath. Hematochezia. EXAM: NUCLEAR MEDICINE PET SKULL BASE TO THIGH TECHNIQUE: 10.6 mCi F-18 FDG was injected intravenously. Full-ring PET imaging was performed from the skull base to thigh after the radiotracer. CT data was obtained and used for attenuation correction and anatomic localization. Fasting blood glucose: 121 mg/dl COMPARISON:  09/13/2021 CTA chest.  Abdominopelvic CT of 09/14/2021. FINDINGS: Mediastinal blood pool activity: SUV max 2.9 Liver activity: SUV max NA NECK: No areas of abnormal hypermetabolism. Incidental CT findings: Multiple small bilateral cervical nodes are likely reactive. Left maxillary sinus mucous retention cyst or polyp. CHEST: No thoracic nodal hypermetabolism. The inferior left lower lobe pleural based nodule is not hypermetabolic. Similar at 12 mm on 60/8. Incidental CT findings: Tiny hiatal  hernia. Borderline cardiomegaly. ABDOMEN/PELVIS: Bilateral symmetric ovarian hypermetabolism is likely physiologic. No abdominopelvic nodal hypermetabolism. Incidental CT findings: Cholecystectomy. Motion degradation. Cecum is redundant and terminates in the left upper pelvis. The appendix is positioned left of midline. Hysterectomy. Fat containing ventral pelvic wall hernia. SKELETON: No abnormal marrow activity. Incidental CT findings: none IMPRESSION: 1. The left lower lobe pulmonary nodule is not hypermetabolic. This is reassuring, but as some primary bronchogenic carcinomas can be non FDG avid, chest CT follow-up at 3 months is recommended. 2. Incidental findings, including: Tiny hiatal hernia. Possible constipation. Sinus disease. Electronically Signed   By: Abigail Miyamoto M.D.   On: 10/27/2021 14:49    Recent Labs: Lab Results  Component Value Date   WBC 7.9 10/03/2021   HGB 14.4 10/03/2021   PLT 306.0 10/03/2021   NA 135 09/13/2021   K 4.0 09/13/2021   CL 102 09/13/2021   CO2 24 09/13/2021   GLUCOSE 80 09/13/2021   BUN 13 09/13/2021   CREATININE 0.76 09/13/2021   BILITOT 0.3 09/13/2021   ALKPHOS 64 09/13/2021   AST 16 09/13/2021   ALT 21 09/13/2021   PROT 7.8 09/13/2021   ALBUMIN 4.8 09/13/2021   CALCIUM 9.5 09/13/2021    Speciality Comments: No specialty comments available.  Procedures:  No procedures performed Allergies: Penicillins and Aspirin   Assessment / Plan:     Visit Diagnoses: Positive ANA (antinuclear antibody) - Plan: RNP Antibody, Sjogrens syndrome-A extractable nuclear antibody, Anti-DNA antibody, double-stranded, C3 and C4  Positive ANA no specific clinical criteria findings on exam today. Will check specific antibody tests and serum complement level. I have lower pretest suspicion for an active autoimmune cause of symptoms with current findings. However reacting adenopathy can be seen so will also plan to recheck after biopsy pathology reviewed.  Arthralgia,  unspecified joint Myalgia  Body pains are fairly generalized without any specific distribution and without observable physical changes on exam today. There is definitely some component of myofascial pain contributing but working up as above.  Axillary lymphadenopathy  Nothing palpable on exam today, currently ongoing workup planned biopsy.  Orders: Orders Placed This Encounter  Procedures   RNP Antibody   Sjogrens syndrome-A extractable nuclear antibody   Anti-DNA antibody, double-stranded   C3 and C4    No orders of the defined types were placed in this encounter.   Follow-Up Instructions: No follow-ups on file.   Collier Salina, MD  Note - This record has been created using [employer].  Chart creation errors have been sought, but may not always  have been located. Such creation errors do not reflect on  the standard of medical care.

## 2021-11-16 ENCOUNTER — Encounter: Payer: Self-pay | Admitting: Internal Medicine

## 2021-11-16 ENCOUNTER — Other Ambulatory Visit: Payer: Self-pay

## 2021-11-16 ENCOUNTER — Other Ambulatory Visit: Payer: 59

## 2021-11-16 ENCOUNTER — Encounter: Payer: 59 | Admitting: Gastroenterology

## 2021-11-16 ENCOUNTER — Ambulatory Visit (INDEPENDENT_AMBULATORY_CARE_PROVIDER_SITE_OTHER): Payer: 59 | Admitting: Internal Medicine

## 2021-11-16 VITALS — BP 112/78 | HR 73 | Resp 15 | Ht 63.0 in | Wt 209.0 lb

## 2021-11-16 DIAGNOSIS — M791 Myalgia, unspecified site: Secondary | ICD-10-CM

## 2021-11-16 DIAGNOSIS — R768 Other specified abnormal immunological findings in serum: Secondary | ICD-10-CM | POA: Diagnosis not present

## 2021-11-16 DIAGNOSIS — R7689 Other specified abnormal immunological findings in serum: Secondary | ICD-10-CM

## 2021-11-16 DIAGNOSIS — M2550 Pain in unspecified joint: Secondary | ICD-10-CM

## 2021-11-16 DIAGNOSIS — R590 Localized enlarged lymph nodes: Secondary | ICD-10-CM

## 2021-11-16 DIAGNOSIS — N809 Endometriosis, unspecified: Secondary | ICD-10-CM | POA: Insufficient documentation

## 2021-11-17 ENCOUNTER — Other Ambulatory Visit: Payer: Self-pay | Admitting: Family

## 2021-11-18 LAB — C3 AND C4
C3 Complement: 169 mg/dL (ref 83–193)
C4 Complement: 32 mg/dL (ref 15–57)

## 2021-11-18 LAB — RNP ANTIBODY: Ribonucleic Protein(ENA) Antibody, IgG: <1 AI

## 2021-11-18 LAB — SJOGRENS SYNDROME-A EXTRACTABLE NUCLEAR ANTIBODY: SSA (Ro) (ENA) Antibody, IgG: <1 AI

## 2021-11-18 LAB — ANTI-DNA ANTIBODY, DOUBLE-STRANDED: ds DNA Ab: <1 IU/mL

## 2021-11-23 ENCOUNTER — Ambulatory Visit
Admission: RE | Admit: 2021-11-23 | Discharge: 2021-11-23 | Disposition: A | Discharge status: Home / Self Care | Payer: 59 | Source: Ambulatory Visit | Attending: Family | Admitting: Family

## 2021-11-23 ENCOUNTER — Encounter: Payer: 59 | Admitting: Family

## 2021-11-23 ENCOUNTER — Other Ambulatory Visit: Payer: Self-pay

## 2021-11-23 ENCOUNTER — Other Ambulatory Visit (HOSPITAL_COMMUNITY): Payer: Self-pay | Admitting: Diagnostic Radiology

## 2021-11-23 DIAGNOSIS — R9389 Abnormal findings on diagnostic imaging of other specified body structures: Secondary | ICD-10-CM

## 2021-11-23 HISTORY — PX: BREAST BIOPSY: SHX20

## 2021-11-23 MED ORDER — GADOBUTROL 1 MMOL/ML IV SOLN
8.0000 mL | Freq: Once | INTRAVENOUS | Status: AC | PRN
  Administered 2021-11-23: 8 mL via INTRAVENOUS

## 2021-11-24 ENCOUNTER — Other Ambulatory Visit: Payer: Self-pay | Admitting: Family

## 2021-11-24 ENCOUNTER — Ambulatory Visit: Payer: 59 | Admitting: Internal Medicine

## 2021-11-24 DIAGNOSIS — R921 Mammographic calcification found on diagnostic imaging of breast: Secondary | ICD-10-CM

## 2021-11-25 ENCOUNTER — Ambulatory Visit
Admission: RE | Admit: 2021-11-25 | Discharge: 2021-11-25 | Disposition: A | Discharge status: Home / Self Care | Payer: 59 | Source: Ambulatory Visit | Attending: Family | Admitting: Family

## 2021-11-25 ENCOUNTER — Other Ambulatory Visit: Payer: Self-pay

## 2021-11-25 ENCOUNTER — Other Ambulatory Visit (HOSPITAL_COMMUNITY): Payer: Self-pay | Admitting: Diagnostic Radiology

## 2021-11-25 ENCOUNTER — Ambulatory Visit: Payer: 59 | Admitting: Family

## 2021-11-25 DIAGNOSIS — R9389 Abnormal findings on diagnostic imaging of other specified body structures: Secondary | ICD-10-CM

## 2021-11-25 MED ORDER — GADOBUTROL 1 MMOL/ML IV SOLN
10.0000 mL | Freq: Once | INTRAVENOUS | Status: AC | PRN
  Administered 2021-11-25: 10 mL via INTRAVENOUS

## 2021-11-28 ENCOUNTER — Encounter: Payer: Self-pay | Admitting: Family

## 2021-12-01 ENCOUNTER — Other Ambulatory Visit: Payer: Self-pay | Admitting: Nurse Practitioner

## 2021-12-01 ENCOUNTER — Other Ambulatory Visit: Payer: Self-pay | Admitting: Family

## 2021-12-01 ENCOUNTER — Ambulatory Visit: Payer: 59 | Admitting: Gastroenterology

## 2021-12-06 NOTE — Progress Notes (Signed)
FYI- Patient plans to contact us to make follow up appointment once she returns from [place] after Christmas. I called patient to follow up original appointment 12/1 was cancelled. Her labs were all negative for the antibody tests checked regarding her ANA. Breast biopsies were benign pathology so far. She reports rashes on her trunk and some hearing loss which are new. We can see at follow up if these are ongoing or worsening may benefit with further workup.

## 2021-12-21 ENCOUNTER — Other Ambulatory Visit: Payer: Self-pay | Admitting: Gastroenterology

## 2021-12-21 ENCOUNTER — Other Ambulatory Visit: Payer: Self-pay | Admitting: Cardiology

## 2021-12-22 ENCOUNTER — Other Ambulatory Visit: Payer: Self-pay | Admitting: Family

## 2021-12-28 ENCOUNTER — Ambulatory Visit
Admission: RE | Admit: 2021-12-28 | Discharge: 2021-12-28 | Disposition: A | Discharge status: Home / Self Care | Payer: 59 | Source: Ambulatory Visit | Attending: Family | Admitting: Family

## 2021-12-28 DIAGNOSIS — R921 Mammographic calcification found on diagnostic imaging of breast: Secondary | ICD-10-CM

## 2021-12-28 HISTORY — PX: BREAST BIOPSY: SHX20

## 2021-12-31 ENCOUNTER — Other Ambulatory Visit: Payer: Self-pay | Admitting: Family

## 2022-01-01 ENCOUNTER — Other Ambulatory Visit: Payer: Self-pay | Admitting: Cardiology

## 2022-01-01 ENCOUNTER — Other Ambulatory Visit: Payer: Self-pay | Admitting: Family

## 2022-01-06 ENCOUNTER — Other Ambulatory Visit: Payer: Self-pay | Admitting: Family

## 2022-01-11 ENCOUNTER — Other Ambulatory Visit: Payer: Self-pay | Admitting: Family

## 2022-01-13 ENCOUNTER — Ambulatory Visit (INDEPENDENT_AMBULATORY_CARE_PROVIDER_SITE_OTHER): Payer: 59 | Admitting: Family

## 2022-01-13 ENCOUNTER — Encounter: Payer: Self-pay | Admitting: Family

## 2022-01-13 VITALS — BP 101/53 | HR 77 | Temp 98.6°F | Resp 16 | Ht 63.0 in | Wt 218.0 lb

## 2022-01-13 DIAGNOSIS — N6452 Nipple discharge: Secondary | ICD-10-CM

## 2022-01-13 DIAGNOSIS — R635 Abnormal weight gain: Secondary | ICD-10-CM

## 2022-01-13 DIAGNOSIS — M5416 Radiculopathy, lumbar region: Secondary | ICD-10-CM | POA: Diagnosis not present

## 2022-01-13 DIAGNOSIS — F319 Bipolar disorder, unspecified: Secondary | ICD-10-CM

## 2022-01-13 DIAGNOSIS — R768 Other specified abnormal immunological findings in serum: Secondary | ICD-10-CM

## 2022-01-13 DIAGNOSIS — N809 Endometriosis, unspecified: Secondary | ICD-10-CM

## 2022-01-13 DIAGNOSIS — K921 Melena: Secondary | ICD-10-CM

## 2022-01-13 DIAGNOSIS — Z832 Family history of diseases of the blood and blood-forming organs and certain disorders involving the immune mechanism: Secondary | ICD-10-CM | POA: Diagnosis not present

## 2022-01-13 DIAGNOSIS — R911 Solitary pulmonary nodule: Secondary | ICD-10-CM

## 2022-01-13 DIAGNOSIS — Z0000 Encounter for general adult medical examination without abnormal findings: Secondary | ICD-10-CM | POA: Diagnosis not present

## 2022-01-13 LAB — LIPID PANEL
Cholesterol: 250 mg/dL — ABNORMAL HIGH (ref 0–200)
HDL: 29.8 mg/dL — ABNORMAL LOW (ref >39.00)
NonHDL: 220.09
Total CHOL/HDL Ratio: 8
Triglycerides: 267 mg/dL — ABNORMAL HIGH (ref 0.0–149.0)
VLDL: 53.4 mg/dL — ABNORMAL HIGH (ref 0.0–40.0)

## 2022-01-13 LAB — TSH: TSH: 0.63 u[IU]/mL (ref 0.35–5.50)

## 2022-01-13 LAB — LDL CHOLESTEROL, DIRECT: Direct LDL: 170 mg/dL

## 2022-01-13 NOTE — Assessment & Plan Note (Signed)
Reports symptoms are overall stable.  Management per psych. Encouraged pt to continue working with her counselor.

## 2022-01-13 NOTE — Assessment & Plan Note (Signed)
Following with pulmonology. Had negative PET CT with plan for follow up CT chest in 3 months.

## 2022-01-13 NOTE — Assessment & Plan Note (Signed)
Uncontrolled. Will order MRI and then plan to refer to Neurosurgery once these results are available.

## 2022-01-13 NOTE — Patient Instructions (Addendum)
Please schedule routine vision exam and dental exam.  Complete lab work prior to leaving. Please work on weight loss, exercise, quitting smoking.

## 2022-01-13 NOTE — Assessment & Plan Note (Signed)
Management per Rheumatology.

## 2022-01-13 NOTE — Assessment & Plan Note (Signed)
Surgical management per Dr. Marlou Starks. Biopsy was negative.

## 2022-01-13 NOTE — Assessment & Plan Note (Signed)
Discussed healthy diet, exercise, weight loss, and smoking cessation.  Mammo up to date. Declines flu shot/covid vaccines.

## 2022-01-13 NOTE — Progress Notes (Signed)
Subjective:     Patient ID: Wanda Mays, female    DOB: [birth year], 46 y.o.   MRN: [id number]  Chief Complaint  Patient presents with   Annual Exam         HPI  Patient presents today for complete physical.  Immunizations: declines flu shot and covid vaccine. Reports tetanus shot 7 years ago  Diet: diet is poor because she been vomiting some days about 4x a week. She has constant nausea Wt Readings from Last 3 Encounters:  01/13/22 218 lb (98.9 kg)  11/16/21 209 lb (94.8 kg)  10/31/21 208 lb (94.3 kg)  Exercise: some walking Colonoscopy: up to date Pap Smear: N/A hysterectomy Mammogram: up to date Vision: due Dental: due  She c/o pain in the lower abdomen. This is chronic and is located in the suprapubic area. She had a CT abd/pelvis done back in September which noted no acute findings.    Lung nodule-had negative PET scan on 11/3- will repeat a chest CT on 02/07/21.   Hematochezia-resolved.  Hemorrhoids noted on colo.    Esophagitis- noted on endoscopy. Omeprazole was increased to bid x 6 weeks by GI with plan to repeat endo in 6 weeks.    Bipolar disorder- last visit we added Wellbutrin. At [facility].  Also following with counselor. She is on cymbalta 60+ 30 once daily, wellbutrin 150mg  bid,  Latuda 20mg , and trazodone HS prn sleep.   Breast Discharge- saw Dr. Marlou Starks surgery on 1/18 and they are discussing possible surgical removal. Her breast biopsy was benign. Reports intermittent left sided bloody discharge.    Myalgia/+ ANA- she is scheduled for follow up with rheumatology.    Chronic low back pain- reports low back pain, radiates down legs. Trouble standing for long times. Feels like her legs are weak. Reports that she has done extensive physical therapy up to about 2 years ago. This pain is most concerning to her and she would like a referral to a spine specialist.   There are no preventive care reminders to display for this patient.   Past Medical History:   Diagnosis Date   ADD (attention deficit disorder)    Anemia    Arthritis    Asthma    Bipolar 1 disorder (HCC)    DDD (degenerative disc disease), lumbar    DDD (degenerative disc disease), thoracic    Dyspnea    Endometriosis    Fibroid    GERD (gastroesophageal reflux disease)    Hiatal hernia    Hypercholesteremia    Hypertension    Hyperthyroidism    Lung nodule seen on imaging study    OCD (obsessive compulsive disorder)    Pneumonia     Past Surgical History:  Procedure Laterality Date   ABDOMINAL HYSTERECTOMY     BREAST BIOPSY Left 11/23/2021   CESAREAN SECTION     CHOLECYSTECTOMY     COLONOSCOPY     around 2007 Tressie Ellis Flordia did have polyps   LAPAROSCOPIC ABDOMINAL EXPLORATION     UPPER GASTROINTESTINAL ENDOSCOPY      Family History  Problem Relation Age of Onset   Clotting disorder Mother        TIA's   Diabetes type II Mother    Hypertension Mother    Hypertension Father    Diabetes type II Father    Heart Problems Father    Hypertension Sister    Diabetes type II Sister    Heart murmur Brother    Asthma Brother  Asthma Daughter    ADD / ADHD Daughter    ADD / ADHD Son    Asthma Son    Cancer Paternal Uncle    Atrial fibrillation Maternal Grandmother    Cancer Maternal Grandfather    Cancer Paternal Grandfather    Diabetes Mellitus I Paternal Grandfather    Heart Problems Paternal Grandfather    Lung cancer Paternal Grandfather    Colon cancer Neg Hx    Rectal cancer Neg Hx    Stomach cancer Neg Hx     Social History   Socioeconomic History   Marital status: Married    Spouse name: Harrell Gave   Number of children: 5   Years of education: Not on file   Highest education level: Not on file  Occupational History   Not on file  Tobacco Use   Smoking status: Every Day    Packs/day: 0.50    Years: 1.00    Pack years: 0.50    Types: Cigarettes    Passive exposure: Current   Smokeless tobacco: Never  Vaping Use   Vaping Use:  Never used  Substance and Sexual Activity   Alcohol use: Not Currently   Drug use: Not Currently   Sexual activity: Not Currently    Birth control/protection: Surgical    Comment: Partial hysterectomy  Other Topics Concern   Not on file  Social History Narrative   Patient is currently unemployed   CMA, has been doing customer service   Married   5 children   [place]   2006 [place] (son)      2 children local and 3 in [place]      Enjoys crafts, playing with kids, plays piano   1 cat and 1 dog   Completed associates degree   Social Determinants of Radio broadcast assistant Strain: Not on file  Food Insecurity: Not on file  Transportation Needs: Not on file  Physical Activity: Inactive   Days of Exercise per Week: 0 days   Minutes of Exercise per Session: 0 min  Stress: Not on file  Social Connections: Not on file  Intimate Partner Violence: Not on file    Outpatient Medications Prior to Visit  Medication Sig Dispense Refill   ADVAIR DISKUS 250-50 MCG/ACT AEPB Inhale 1 puff into the lungs 2 (two) times daily. 60 each 2   albuterol (VENTOLIN HFA) 108 (90 Base) MCG/ACT inhaler Inhale 2 puffs into the lungs every 6 (six) hours as needed for wheezing or shortness of breath. 18 g 0   buPROPion (WELLBUTRIN SR) 150 MG 12 hr tablet TAKE 1 TABLET BY MOUTH TWICE A DAY 180 tablet 1   DULoxetine (CYMBALTA) 30 MG capsule Take by mouth.     DULoxetine (CYMBALTA) 60 MG capsule Take 90 mg by mouth daily.     gabapentin (NEURONTIN) 100 MG capsule Take 1 capsule (100 mg total) by mouth 3 (three) times daily. 90 capsule 3   hydrochlorothiazide (HYDRODIURIL) 25 MG tablet TAKE 1 TABLET (25 MG TOTAL) BY MOUTH DAILY. 90 tablet 1   hydrOXYzine (ATARAX) 25 MG tablet TAKE 1 TABLET BY MOUTH 3 TIMES DAILY AS NEEDED FOR ANXIETY. 42 tablet 0   hyoscyamine (LEVSIN SL) 0.125 MG SL tablet PLACE 1 TABLET UNDER THE TONGUE EVERY 6 HOURS AS NEEDED FOR  CRAMPING. 30 tablet 3   ipratropium-albuterol (DUONEB) 0.5-2.5 (3) MG/3ML SOLN Take 3 mLs by nebulization every 4 (  four) hours as needed. 72 mL 1   LATUDA 20 MG TABS tablet SMARTSIG:1 Tablet(s) By Mouth Every Evening     meclizine (ANTIVERT) 25 MG tablet TAKE 1 TABLET BY MOUTH 3 TIMES A DAY AS NEEDED FOR DIZZINESS 30 tablet 0   methocarbamol (ROBAXIN) 500 MG tablet TAKE 1 TABLET BY MOUTH EVERY 8 HOURS AS NEEDED FOR MUSCLE SPASM 30 tablet 0   nystatin cream (MYCOSTATIN) Apply 1 application topically 2 (two) times daily. 30 g 0   omeprazole (PRILOSEC) 40 MG capsule Take 1 capsule (40 mg total) by mouth 2 (two) times daily. Prilosec 40 mg twice daily for 6 weeks, then reduce to 40 mg daily and titrate to lowest effective dose to control reflux. 90 capsule 3   ondansetron (ZOFRAN) 4 MG tablet Take 1 tablet (4 mg total) by mouth every 8 (eight) hours as needed for nausea or vomiting. 30 tablet 1   ondansetron (ZOFRAN) 4 MG tablet Take by mouth.     potassium chloride (KLOR-CON) 10 MEQ tablet Take 1 tablet (10 mEq total) by mouth daily. 90 tablet 0   propranolol (INDERAL) 40 MG tablet TAKE 1 TABLET (40 MG TOTAL) BY MOUTH EVERY 8 (EIGHT) HOURS. 270 tablet 3   propranolol (INDERAL) 60 MG tablet Take 60 mg by mouth 2 (two) times daily as needed.     traZODone (DESYREL) 100 MG tablet TAKE 1-2 TABLETS (100-200 MG TOTAL) BY MOUTH AT BEDTIME AS NEEDED. 90 tablet 0   DULoxetine (CYMBALTA) 60 MG capsule Take by mouth.     Elagolix Sodium (ORILISSA) 150 MG TABS Take 1 tablet by mouth daily. (Patient not taking: Reported on 11/16/2021) 30 tablet 6   No facility-administered medications prior to visit.    Allergies  Allergen Reactions   Penicillins Hives   Aspirin     Review of Systems  Constitutional:  Negative for weight loss.  HENT:  Positive for hearing loss (left ear). Negative for congestion.   Eyes:  Negative for blurred vision.  Respiratory:  Positive for cough (chronic).   Cardiovascular:   Negative for chest pain.  Gastrointestinal:  Negative for blood in stool, constipation and diarrhea.  Genitourinary:  Negative for dysuria and hematuria.  Musculoskeletal:  Positive for myalgias.  Skin:  Positive for rash (intermittent "goose bumps" that are itchy).  Neurological:  Positive for headaches (some headaches (migraines) this week).  Psychiatric/Behavioral:         See HPI      Objective:    Physical Exam  BP (!) 101/53 (BP Location: Right Arm, Patient Position: Sitting, Cuff Size: Large)    Pulse 77    Temp 98.6 F (37 C) (Oral)    Resp 16    Ht 5\' 3"  (1.6 m)    Wt 218 lb (98.9 kg)    SpO2 97%    BMI 38.62 kg/m  Wt Readings from Last 3 Encounters:  01/13/22 218 lb (98.9 kg)  11/16/21 209 lb (94.8 kg)  10/31/21 208 lb (94.3 kg)   Physical Exam  Constitutional: She is oriented to person, place, and time. She appears well-developed and well-nourished. No distress.  HENT:  Head: Normocephalic and atraumatic.  Right Ear: Tympanic membrane and ear canal normal.  Left Ear: Tympanic membrane and ear canal normal.  Mouth/Throat: Not examined- pt wearing mask.  Eyes: Pupils are equal, round, and reactive to light. No scleral icterus.  Neck: Normal range of motion. No thyromegaly present.  Cardiovascular: Normal rate and regular rhythm.  No murmur heard. Pulmonary/Chest: Effort normal and breath sounds normal. No respiratory distress. He has no wheezes. She has no rales. She exhibits no tenderness.  Abdominal: Soft. Bowel sounds are normal. She exhibits no distension and no mass. There is no tenderness. There is no rebound and no guarding.  Musculoskeletal: She exhibits no edema.  Lymphadenopathy:    She has no cervical adenopathy.  Neurological: She is alert and oriented to person, place, and time. She has normal patellar reflexes. Bilateral LE strength is 4-5/5. Coordination normal.  Skin: Skin is warm and dry.  Psychiatric: She has a normal mood and affect. Her behavior  is normal. Judgment and thought content normal.  Breast/pelvic: deferred          Assessment & Plan:       Assessment & Plan:   Problem List Items Addressed This Visit       Unprioritized   Preventative health care    Discussed healthy diet, exercise, weight loss, and smoking cessation.  Mammo up to date. Declines flu shot/covid vaccines.       Positive ANA (antinuclear antibody)    Management per Rheumatology.       Lung nodule    Following with pulmonology. Had negative PET CT with plan for follow up CT chest in 3 months.       Lumbar radiculopathy    Uncontrolled. Will order MRI and then plan to refer to Neurosurgery once these results are available.       Relevant Orders   MR Lumbar Spine Wo Contrast   Hematochezia    Resolved.  No concerning findings on colo- just hemorrhoids.       Endometriosis determined by laparoscopy    I suspect that her suprapubic pain is either related to her hx of endometriosis or scar tissue from previous C-section/hysterectomy.  She would like a second opinion from anther GYN. Referral has been placed.       Breast discharge    Surgical management per Dr. Marlou Starks. Biopsy was negative.       Bipolar 1 disorder ([place])    Reports symptoms are overall stable.  Management per psych. Encouraged pt to continue working with her counselor.       Other Visit Diagnoses     Endometriosis    -  Primary   Relevant Orders   Ambulatory referral to Obstetrics / Gynecology   Family history of clotting disorder       Relevant Orders   Antithrombin III   Protein C activity   Protein C, total   Protein S activity   Protein S, total   Lupus anticoagulant panel   Beta-2-glycoprotein i abs, IgG/M/A   Homocysteine, serum   Factor 5 leiden   Prothrombin gene mutation   Cardiolipin antibodies, IgG, IgM, IgA   Weight gain       Relevant Orders   Lipid panel   Morbid obesity ([place])       Relevant Orders   TSH       I am having Wanda Mays maintain her albuterol, DULoxetine, ipratropium-albuterol, omeprazole, gabapentin, nystatin cream, ondansetron, Advair Diskus, potassium chloride, [place], hyoscyamine, propranolol, DULoxetine, Latuda, ondansetron, buPROPion, propranolol, meclizine, hydrOXYzine, hydrochlorothiazide, methocarbamol, and traZODone.  No orders of the defined types were placed in this encounter.

## 2022-01-13 NOTE — Assessment & Plan Note (Signed)
Resolved.  No concerning findings on colo- just hemorrhoids.

## 2022-01-13 NOTE — Assessment & Plan Note (Signed)
I suspect that her suprapubic pain is either related to her hx of endometriosis or scar tissue from previous C-section/hysterectomy.  She would like a second opinion from anther GYN. Referral has been placed.

## 2022-01-14 LAB — BETA-2-GLYCOPROTEIN I ABS, IGG/M/A
Beta-2 Glyco 1 IgA: <9 GPI IgA units (ref 0–25)
Beta-2 Glyco 1 IgM: 10 GPI IgM units (ref 0–32)
Beta-2 Glyco I IgG: <9 GPI IgG units (ref 0–20)

## 2022-01-15 LAB — HOMOCYSTEINE: Homocysteine: 18.4 umol/L — ABNORMAL HIGH (ref <10.4)

## 2022-01-16 LAB — LUPUS ANTICOAGULANT PANEL
Dilute Viper Venom Time: 52.2 s — ABNORMAL HIGH (ref 0.0–47.0)
PTT Lupus Anticoagulant: 45.6 s (ref 0.0–51.9)

## 2022-01-16 LAB — DRVVT CONFIRM: dRVVT Confirm: 1.5 ratio — ABNORMAL HIGH (ref 0.8–1.2)

## 2022-01-16 LAB — DRVVT MIX: dRVVT Mix: 41.8 s — ABNORMAL HIGH (ref 0.0–40.4)

## 2022-01-17 LAB — ANTITHROMBIN III: AntiThromb III Func: 130 % normal (ref 80–135)

## 2022-01-17 LAB — PROTEIN S ACTIVITY: Protein S Activity: 89 % normal (ref 60–140)

## 2022-01-17 LAB — PROTEIN C ACTIVITY: Protein C Activity: 198 % normal — ABNORMAL HIGH (ref 70–180)

## 2022-01-17 LAB — PROTEIN S, TOTAL: PROTEIN S ANTIGEN, TOTAL: 124 % normal (ref 70–140)

## 2022-01-17 LAB — PROTEIN C, TOTAL: Protein C Antigen: 128 % normal (ref 70–140)

## 2022-01-17 NOTE — Progress Notes (Unsigned)
Office Visit Note  Patient: Wanda Mays             Date of Birth: Jun 29, 1976           MRN: [id number]             PCP: [name], NP Referring: [name], NP Visit Date: 01/18/2022   Subjective:  No chief complaint on file.   History of Present Illness: Wanda Mays is a 46 y.o. female here for follow up for multiple somewhat nonspecific symptoms including fatigue, joint and muscle pains, lymphadenopathy, and rashes with positive ANA. Labs checked in primary care office show low positive lupus anticoagulant test. She continues with pain in multiple areas especially low back with some radiation. Feet are numb some of the time only within the past few months. She still gets finger violaceous discoloration intermittently. Vomiting is decreased in frequency now about twice per week instead of all the time, and has some diarrhea intermittently. Bloody nipple discharge without any underlying problem identified. She is already planned for upcoming back MRI.  Previous HPI 11/16/21 Wanda Mays is a 46 y.o. female here for evaluation of multiple symptoms including joint and muscle pains, fatigue, nausea and vomiting, and lymphadenopathy and positive ANA.  She has had chronic pain to some extent ever since multiple levels back injury since around 2011.  This did not require any major interventions was not on long-term medication for symptoms.  Her physical activity has been very low since this increases back pain symptoms.  Since at least around 2014 has had GI issues with some hematochezia frequently having nausea vomiting and food intolerance.  Evaluation with gastroenterology has never identified any clear problem outside of GERD. All of her problems are dramatically increased more recently she had a 3 rounds of COVID infection most recently the summer and subsequently doing worse.  She has pain daily especially around the shoulders back and hips not particularly associated  with level of activity position or anything else.  Some good days and some bad days more often bad.  GI symptoms also doing much worse having frequent vomiting and describes being unable to eat solid foods for up to 5 days at a time. She has developed vertigo symptoms improved after starting meclizine treatment.  No measured or reported orthostatic hypotension no syncope.  She has had some skin rashes with itchy goosebumps appearing changes she describes on her arms and face.  Episodic discoloration in hands with pallor in her fingers she reports after they are hanging in a downward position for a long time. More recently also had CT chest imaging due to chest pains and elevated D-dimer that demonstrated 12 mm lung nodule.  Subsequent PET FDG scan was negative for metabolic enhancement of this nodule.  She also had axillary adenopathy on breast imaging has been scheduled for biopsy in a week. Several medications have been tried for symptom treatment with this including gabapentin, methocarbamol, and cymbalta. She has major depression recently worsened as well now on wellbutrin.   No Rheumatology ROS completed.   PMFS History:  Patient Active Problem List   Diagnosis Date Noted   Lupus anticoagulant positive 01/18/2022   Homocysteine level above reference range 01/18/2022   Preventative health care 01/13/2022   Lumbar radiculopathy 01/13/2022   Breast discharge 01/13/2022   Endometriosis determined by laparoscopy 11/16/2021   Positive ANA (antinuclear antibody) 11/16/2021   Axillary lymphadenopathy 10/11/2021   Arthralgia 10/11/2021   Myalgia 10/11/2021   Primary  hypertension 10/11/2021   Hematochezia 09/15/2021   Hyperthyroidism 09/15/2021   Chest pain 09/15/2021   Lung nodule 09/14/2021   Bipolar 1 disorder ([place]) 08/25/2021   GERD (gastroesophageal reflux disease) 08/25/2021   Hypercholesteremia 08/25/2021   Degenerative disc disease, cervical 08/08/2021   Degenerative disc disease,  lumbar 10/23/2012   Bronchitis 04/21/2011    Past Medical History:  Diagnosis Date   ADD (attention deficit disorder)    Anemia    Arthritis    Asthma    Bipolar 1 disorder (HCC)    DDD (degenerative disc disease), lumbar    DDD (degenerative disc disease), thoracic    Dyspnea    Endometriosis    Fibroid    GERD (gastroesophageal reflux disease)    Hiatal hernia    Hypercholesteremia    Hypertension    Hyperthyroidism    Lung nodule seen on imaging study    OCD (obsessive compulsive disorder)    Pneumonia     Family History  Problem Relation Age of Onset   Clotting disorder Mother        TIA's   Diabetes type II Mother    Hypertension Mother    Hypertension Father    Diabetes type II Father    Heart Problems Father    Hypertension Sister    Diabetes type II Sister    Heart murmur Brother    Asthma Brother    Asthma Daughter    ADD / ADHD Daughter    ADD / ADHD Son    Asthma Son    Cancer Paternal Uncle    Atrial fibrillation Maternal Grandmother    Cancer Maternal Grandfather    Cancer Paternal Grandfather    Diabetes Mellitus I Paternal Grandfather    Heart Problems Paternal Grandfather    Lung cancer Paternal Grandfather    Colon cancer Neg Hx    Rectal cancer Neg Hx    Stomach cancer Neg Hx    Past Surgical History:  Procedure Laterality Date   ABDOMINAL HYSTERECTOMY     BREAST BIOPSY Left 11/23/2021   BREAST BIOPSY Left 12/28/2021   CESAREAN SECTION     CHOLECYSTECTOMY     COLONOSCOPY     around 2007 Stuart Flordia did have polyps   LAPAROSCOPIC ABDOMINAL EXPLORATION     UPPER GASTROINTESTINAL ENDOSCOPY     Social History   Social History Narrative   Patient is currently unemployed   CMA, has been doing customer service   Married   5 children   [place]   2006 [place]   2010- Dellie [place] (son)      2 children local and 3 in [place]      Enjoys crafts, playing with kids, plays piano   1 cat and 1 dog    Completed associates degree   Immunization History  Administered Date(s) Administered   Td 12/25/2014     Objective: Vital Signs: BP 103/74 (BP Location: Left Arm, Patient Position: Sitting, Cuff Size: Large)    Resp 12    Ht 5\' 3"  (1.6 m)    Wt 221 lb 9.6 oz (100.5 kg)    BMI 39.25 kg/m    Physical Exam Constitutional:      Appearance: She is obese.  Cardiovascular:     Rate and Rhythm: Normal rate and regular rhythm.  Pulmonary:     Effort: Pulmonary effort is normal.     Breath sounds: Normal breath sounds.  Musculoskeletal:  Right lower leg: No edema.     Left lower leg: No edema.  Skin:    General: Skin is warm and dry.     Comments: Faintly purple discolored distal index fingers, nail yellowing, no digital pitting or tapering  Neurological:     Mental Status: She is alert.  Psychiatric:        Mood and Affect: Mood normal.     Musculoskeletal Exam:  Neck full ROM no tenderness Shoulders full ROM no tenderness or swelling Elbows full ROM no tenderness or swelling Wrists full ROM no tenderness or swelling Fingers full ROM no tenderness or swelling Tenderness to pressure in low back both sides, mild tenderness over thoracic spine area Hip normal internal and external rotation without pain, no tenderness to lateral hip palpation Knees full ROM no tenderness or swelling   Investigation: No additional findings.  Imaging: MM CLIP PLACEMENT LEFT  Result Date: 12/28/2021 CLINICAL DATA:  Status post stereotactic biopsy of the left breast. EXAM: 3D DIAGNOSTIC LEFT MAMMOGRAM POST ULTRASOUND BIOPSY COMPARISON:  Previous exam(s). FINDINGS: 3D Mammographic images were obtained following stereotactic guided biopsy of the left breast. The biopsy marking clip is in expected position in the upper-inner quadrant of the left breast. IMPRESSION: Appropriate positioning of the X shaped biopsy marking clip at the site of biopsy in the upper-inner quadrant of the left breast. Final  Assessment: Post Procedure Mammograms for Marker Placement Electronically Signed   By: Lillia Mountain M.D.   On: 12/28/2021 09:13  MM LT BREAST BX W LOC DEV 1ST LESION IMAGE BX SPEC STEREO GUIDE  Addendum Date: 01/09/2022   ADDENDUM REPORT: 01/09/2022 08:05 ADDENDUM: Pathology revealed COLUMNAR CELL AND FIBROCYSTIC CHANGES WITH APOCRINE METAPLASIA AND CALCIFICATIONS of the LEFT breast, upper inner quadrant, (x clip). This was found to be concordant by Dr. Lillia Mountain. Pathology results were discussed with the patient by telephone. The patient reported doing well after the biopsy with tenderness at the site. Post biopsy instructions and care were reviewed and questions were answered. The patient was encouraged to call [facility] for any additional concerns. My direct phone number was provided. Surgical consultation for further evaluation of LEFT bloody nipple discharge has been arranged with Dr. Autumn Messing at [facility] on January 11, 2022. Pathology results reported by Terie Purser, RN on 12/30/2021. Electronically Signed   By: Lillia Mountain M.D.   On: 01/09/2022 08:05   Result Date: 01/09/2022 CLINICAL DATA:  Left breast calcifications. EXAM: LEFT BREAST STEREOTACTIC CORE NEEDLE BIOPSY COMPARISON:  Previous exams. FINDINGS: The patient and I discussed the procedure of stereotactic-guided biopsy including benefits and alternatives. We discussed the high likelihood of a successful procedure. We discussed the risks of the procedure including infection, bleeding, tissue injury, clip migration, and inadequate sampling. Informed written consent was given. The usual time out protocol was performed immediately prior to the procedure. Using sterile technique and 1% lidocaine and 1% lidocaine with epinephrine as local anesthetic, under stereotactic guidance, a 9 gauge vacuum assisted device was used to perform core needle biopsy of calcifications in the upper-inner quadrant of the left  breast using a superior to inferior approach. Specimen radiograph was performed showing calcifications are present in the tissue samples. Specimens with calcifications are identified for pathology. Lesion quadrant: Upper inner quadrant At the conclusion of the procedure, X shaped tissue marker clip was deployed into the biopsy cavity. Follow-up 2-view mammogram was performed and dictated separately. IMPRESSION: Stereotactic-guided biopsy of calcifications in  the upper-inner quadrant of the left breast. No apparent complications. Electronically Signed: By: Lillia Mountain M.D. On: 12/28/2021 09:09   Recent Labs: Lab Results  Component Value Date   WBC 7.9 10/03/2021   HGB 14.4 10/03/2021   PLT 306.0 10/03/2021   NA 135 09/13/2021   K 4.0 09/13/2021   CL 102 09/13/2021   CO2 24 09/13/2021   GLUCOSE 80 09/13/2021   BUN 13 09/13/2021   CREATININE 0.76 09/13/2021   BILITOT 0.3 09/13/2021   ALKPHOS 64 09/13/2021   AST 16 09/13/2021   ALT 21 09/13/2021   PROT 7.8 09/13/2021   ALBUMIN 4.8 09/13/2021   CALCIUM 9.5 09/13/2021    Speciality Comments: No specialty comments available.  Procedures:  No procedures performed Allergies: Penicillins and Aspirin   Assessment / Plan:     Visit Diagnoses: Positive ANA (antinuclear antibody) - Plan: ANA  Lupus anticoagulant positive  Lumbar radiculopathy  Homocysteine level above reference range - Plan: Vitamin B12, Folate  ***  Orders: Orders Placed This Encounter  Procedures   Vitamin B12   Folate   ANA   No orders of the defined types were placed in this encounter.    Follow-Up Instructions: Return in about 11 weeks (around 04/05/2022) for ?ANA HCQ trial LA retest f/u ~11wks.   Collier Salina, MD  Note - This record has been created using [employer].  Chart creation errors have been sought, but may not always  have been located. Such creation errors do not reflect on  the standard of medical care.

## 2022-01-18 ENCOUNTER — Ambulatory Visit (INDEPENDENT_AMBULATORY_CARE_PROVIDER_SITE_OTHER): Payer: 59 | Admitting: Internal Medicine

## 2022-01-18 ENCOUNTER — Other Ambulatory Visit: Payer: Self-pay

## 2022-01-18 ENCOUNTER — Encounter: Payer: Self-pay | Admitting: Internal Medicine

## 2022-01-18 VITALS — BP 103/74 | Resp 12 | Ht 63.0 in | Wt 221.6 lb

## 2022-01-18 DIAGNOSIS — R7989 Other specified abnormal findings of blood chemistry: Secondary | ICD-10-CM | POA: Diagnosis not present

## 2022-01-18 DIAGNOSIS — R760 Raised antibody titer: Secondary | ICD-10-CM | POA: Diagnosis not present

## 2022-01-18 DIAGNOSIS — R768 Other specified abnormal immunological findings in serum: Secondary | ICD-10-CM | POA: Diagnosis not present

## 2022-01-18 DIAGNOSIS — M5416 Radiculopathy, lumbar region: Secondary | ICD-10-CM

## 2022-01-20 LAB — ANA: Anti Nuclear Antibody (ANA): POSITIVE — AB

## 2022-01-20 LAB — FOLATE: Folate: 3.8 ng/mL — ABNORMAL LOW

## 2022-01-20 LAB — VITAMIN B12: Vitamin B-12: 322 pg/mL (ref 200–1100)

## 2022-01-20 LAB — ANTI-NUCLEAR AB-TITER (ANA TITER): ANA Titer 1: 1:40 {titer} — ABNORMAL HIGH

## 2022-01-21 ENCOUNTER — Encounter: Payer: Self-pay | Admitting: Internal Medicine

## 2022-01-22 ENCOUNTER — Telehealth: Payer: Self-pay | Admitting: Family

## 2022-01-22 DIAGNOSIS — Z832 Family history of diseases of the blood and blood-forming organs and certain disorders involving the immune mechanism: Secondary | ICD-10-CM

## 2022-01-22 NOTE — Telephone Encounter (Signed)
See mychart.  

## 2022-01-23 ENCOUNTER — Encounter: Payer: Self-pay | Admitting: Family

## 2022-01-23 LAB — FACTOR 5 LEIDEN: Result: NEGATIVE

## 2022-01-23 LAB — CARDIOLIPIN ANTIBODIES, IGG, IGM, IGA
Anticardiolipin IgA: <2 APL-U/mL
Anticardiolipin IgG: <2 GPL-U/mL
Anticardiolipin IgM: 9.4 MPL-U/mL

## 2022-01-23 LAB — PROTHROMBIN GENE MUTATION: PROTHROMBIN (FACTOR II): NEGATIVE

## 2022-01-23 MED ORDER — HYDROXYCHLOROQUINE SULFATE 200 MG PO TABS: 200.0000 mg | ORAL_TABLET | Freq: Every day | ORAL | 2 refills | Status: DC

## 2022-01-23 MED ORDER — LORAZEPAM 1 MG PO TABS: ORAL_TABLET | ORAL | 0 refills | Status: AC

## 2022-01-23 NOTE — Addendum Note (Signed)
Addended by: [name] on: 01/23/2022 12:44 PM   Modules accepted: Orders

## 2022-01-24 ENCOUNTER — Other Ambulatory Visit: Payer: Self-pay

## 2022-01-24 ENCOUNTER — Encounter: Payer: Self-pay | Admitting: Hematology & Oncology

## 2022-01-24 ENCOUNTER — Inpatient Hospital Stay: Payer: 59

## 2022-01-24 ENCOUNTER — Inpatient Hospital Stay: Payer: 59 | Attending: Hematology & Oncology | Admitting: Hematology & Oncology

## 2022-01-24 VITALS — BP 99/82 | HR 64 | Temp 97.7°F | Resp 16 | Wt 216.0 lb

## 2022-01-24 DIAGNOSIS — R760 Raised antibody titer: Secondary | ICD-10-CM | POA: Diagnosis present

## 2022-01-24 NOTE — Progress Notes (Signed)
Referral MD  Reason for Referral: Positive lupus anticoagulant test-no history of blood clots  Chief Complaint  Patient presents with   New Patient (Initial Visit)  : I am not sure why was sent to see you.  HPI: Wanda Mays is a very charming 46 year old white female.  She was sent appear by Dr. Debbrah Mays.  Wanda Mays has multiple medical problems.  She is a 46 years old.  She has been through quite a lot.  I think she currently is dealing with some type of rheumatologic issue.  She is tested positive for ANA.  She is on Plaquenil right now.  She has 5 children.  She has never had a miscarriage or abortion.  She just has a lot of fatigue.  Again she has a lot of health issues.  She is not able to work.  She has had multiple surgeries..  She does smoke.  She does not drink.  I am unsure as to why she was tested for lupus anticoagulant but she was.  Few weeks ago, Dr. Inda Mays, been incredibly thorough, did a bunch of antibody studies on her.  One of the studies was a hypercoagulable panel.  And the only positive test was a lupus anticoagulant.  She did not have elevated anticardiolipin antibodies or beta-2 glycoproteins.  Again she has never had a blood clot.  Her mother has had strokes.  She is 46 years old.  There has been no problems with her bowels or bladder.  She does get routine mammograms.  She actually has had a breast biopsy.  I think this was done recently on 12/28/2021.  I left breast biopsy was done.  The pathology report ([id number]) showed no malignancy.  There was columnar cell and fibrocystic changes with apocrine metaplasia.  She has had I think colonoscopies and upper endoscopies.  She was referred to the [facility] because this lupus anticoagulant test that was positive.  Currently, I would say performance status is probably ECOG 1.     Past Medical History:  Diagnosis Date   ADD (attention deficit disorder)    Anemia    Arthritis     Asthma    Bipolar 1 disorder (HCC)    DDD (degenerative disc disease), lumbar    DDD (degenerative disc disease), thoracic    Dyspnea    Endometriosis    Fibroid    GERD (gastroesophageal reflux disease)    Hiatal hernia    Hypercholesteremia    Hypertension    Hyperthyroidism    Lung nodule seen on imaging study    OCD (obsessive compulsive disorder)    Pneumonia   :   Past Surgical History:  Procedure Laterality Date   ABDOMINAL HYSTERECTOMY     BREAST BIOPSY Left 11/23/2021   BREAST BIOPSY Left 12/28/2021   CESAREAN SECTION     CHOLECYSTECTOMY     COLONOSCOPY     around 2007 [place] did have polyps   LAPAROSCOPIC ABDOMINAL EXPLORATION     UPPER GASTROINTESTINAL ENDOSCOPY    :   Current Outpatient Medications:    ADVAIR DISKUS 250-50 MCG/ACT AEPB, Inhale 1 puff into the lungs 2 (two) times daily., Disp: 60 each, Rfl: 2   albuterol (VENTOLIN HFA) 108 (90 Base) MCG/ACT inhaler, Inhale 2 puffs into the lungs every 6 (six) hours as needed for wheezing or shortness of breath., Disp: 18 g, Rfl: 0   buPROPion (WELLBUTRIN SR) 150 MG 12 hr tablet, TAKE 1 TABLET BY MOUTH TWICE  A DAY, Disp: 180 tablet, Rfl: 1   DULoxetine (CYMBALTA) 30 MG capsule, Take by mouth., Disp: , Rfl:    DULoxetine (CYMBALTA) 60 MG capsule, Take 90 mg by mouth daily., Disp: , Rfl:    gabapentin (NEURONTIN) 100 MG capsule, Take 1 capsule (100 mg total) by mouth 3 (three) times daily., Disp: 90 capsule, Rfl: 3   hydrochlorothiazide (HYDRODIURIL) 25 MG tablet, TAKE 1 TABLET (25 MG TOTAL) BY MOUTH DAILY., Disp: 90 tablet, Rfl: 1   hydroxychloroquine (PLAQUENIL) 200 MG tablet, Take 1 tablet (200 mg total) by mouth daily., Disp: 30 tablet, Rfl: 2   hydrOXYzine (ATARAX) 25 MG tablet, TAKE 1 TABLET BY MOUTH 3 TIMES DAILY AS NEEDED FOR ANXIETY., Disp: 42 tablet, Rfl: 0   hyoscyamine (LEVSIN SL) 0.125 MG SL tablet, PLACE 1 TABLET UNDER THE TONGUE EVERY 6 HOURS AS NEEDED FOR CRAMPING., Disp: 30 tablet, Rfl: 3    ipratropium-albuterol (DUONEB) 0.5-2.5 (3) MG/3ML SOLN, Take 3 mLs by nebulization every 4 (four) hours as needed., Disp: 72 mL, Rfl: 1   LATUDA 20 MG TABS tablet, SMARTSIG:1 Tablet(s) By Mouth Every Evening, Disp: , Rfl:    LORazepam (ATIVAN) 1 MG tablet, One tablet by mouth once, 30 minutes prior to MRI- do not drive after taking., Disp: 1 tablet, Rfl: 0   meclizine (ANTIVERT) 25 MG tablet, TAKE 1 TABLET BY MOUTH 3 TIMES A DAY AS NEEDED FOR DIZZINESS, Disp: 30 tablet, Rfl: 0   methocarbamol (ROBAXIN) 500 MG tablet, TAKE 1 TABLET BY MOUTH EVERY 8 HOURS AS NEEDED FOR MUSCLE SPASM, Disp: 30 tablet, Rfl: 0   nystatin cream (MYCOSTATIN), Apply 1 application topically 2 (two) times daily., Disp: 30 g, Rfl: 0   omeprazole (PRILOSEC) 40 MG capsule, Take 1 capsule (40 mg total) by mouth 2 (two) times daily. Prilosec 40 mg twice daily for 6 weeks, then reduce to 40 mg daily and titrate to lowest effective dose to control reflux., Disp: 90 capsule, Rfl: 3   ondansetron (ZOFRAN) 4 MG tablet, Take 1 tablet (4 mg total) by mouth every 8 (eight) hours as needed for nausea or vomiting., Disp: 30 tablet, Rfl: 1   ondansetron (ZOFRAN) 4 MG tablet, Take by mouth., Disp: , Rfl:    potassium chloride (KLOR-CON) 10 MEQ tablet, Take 1 tablet (10 mEq total) by mouth daily., Disp: 90 tablet, Rfl: 0   propranolol (INDERAL) 40 MG tablet, TAKE 1 TABLET (40 MG TOTAL) BY MOUTH EVERY 8 (EIGHT) HOURS., Disp: 270 tablet, Rfl: 3   propranolol (INDERAL) 60 MG tablet, Take 60 mg by mouth 2 (two) times daily as needed., Disp: , Rfl:    traZODone (DESYREL) 100 MG tablet, TAKE 1-2 TABLETS (100-200 MG TOTAL) BY MOUTH AT BEDTIME AS NEEDED., Disp: 90 tablet, Rfl: 0:  :   Allergies  Allergen Reactions   Penicillins Hives   Aspirin   :   Family History  Problem Relation Age of Onset   Clotting disorder Mother        TIA's   Diabetes type II Mother    Hypertension Mother    Hypertension Father    Diabetes type II Father     Heart Problems Father    Hypertension Sister    Diabetes type II Sister    Heart murmur Brother    Asthma Brother    Asthma Daughter    ADD / ADHD Daughter    ADD / ADHD Son    Asthma Son    Cancer Paternal Uncle  Atrial fibrillation Maternal Grandmother    Cancer Maternal Grandfather    Cancer Paternal Grandfather    Diabetes Mellitus I Paternal Grandfather    Heart Problems Paternal Grandfather    Lung cancer Paternal Grandfather    Colon cancer Neg Hx    Rectal cancer Neg Hx    Stomach cancer Neg Hx   :   Social History   Socioeconomic History   Marital status: Married    Spouse name: Harrell Gave   Number of children: 5   Years of education: Not on file   Highest education level: Not on file  Occupational History   Not on file  Tobacco Use   Smoking status: Every Day    Packs/day: 0.25    Years: 1.00    Pack years: 0.25    Types: Cigarettes    Passive exposure: Current   Smokeless tobacco: Never   Tobacco comments:    3 a day as of 01/24/22  Vaping Use   Vaping Use: Former  Substance and Sexual Activity   Alcohol use: Not Currently   Drug use: Not Currently   Sexual activity: Not Currently    Birth control/protection: Surgical    Comment: Partial hysterectomy  Other Topics Concern   Not on file  Social History Narrative   Patient is currently unemployed   CMA, has been doing customer service   Married   5 children   [place]   2006 [place] (son)      2 children local and 3 in [place]      Enjoys crafts, playing with kids, plays piano   1 cat and 1 dog   Completed associates degree   Social Determinants of Radio broadcast assistant Strain: Not on file  Food Insecurity: Not on file  Transportation Needs: Not on file  Physical Activity: Inactive   Days of Exercise per Week: 0 days   Minutes of Exercise per Session: 0 min  Stress: Not on file  Social Connections: Not on file  Intimate  Partner Violence: Not on file  :  Review of Systems  Constitutional:  Positive for malaise/fatigue.  HENT: Negative.    Eyes: Negative.   Respiratory: Negative.    Cardiovascular: Negative.   Gastrointestinal:  Positive for nausea.  Skin: Negative.     Exam: @IPVITALS @ Physical Exam Vitals reviewed.  HENT:     Head: Normocephalic and atraumatic.  Eyes:     Pupils: Pupils are equal, round, and reactive to light.  Cardiovascular:     Rate and Rhythm: Normal rate and regular rhythm.     Heart sounds: Normal heart sounds.  Pulmonary:     Effort: Pulmonary effort is normal.     Breath sounds: Normal breath sounds.  Abdominal:     General: Bowel sounds are normal.     Palpations: Abdomen is soft.  Musculoskeletal:        General: No tenderness or deformity. Normal range of motion.     Cervical back: Normal range of motion.  Lymphadenopathy:     Cervical: No cervical adenopathy.  Skin:    General: Skin is warm and dry.     Findings: No erythema or rash.  Neurological:     Mental Status: She is alert and oriented to person, place, and time.  Psychiatric:        Behavior: Behavior normal.        Thought Content:  Thought content normal.        Judgment: Judgment normal.     No results for input(s): WBC, HGB, HCT, PLT in the last 72 hours. No results for input(s): NA, K, CL, CO2, GLUCOSE, BUN, CREATININE, CALCIUM in the last 72 hours.  Blood smear review: None  Pathology: None    Assessment and Plan: Ms. Fleeman is a very charming 46 year old white female.  She clearly has some underlying rheumatologic issue.  She tested positive for lupus anticoagulant.  She has never had a thromboembolic event.  More important count was that she has never had a miscarriage.  A lot of times, women who carry the lupus anticoagulant will have a hard time getting pregnant and will have miscarriages.  Is hard to know if there is lupus anticoagulant is permanent.  I would suspect that it might  be given that she has an underlying rheumatologic condition.  I know she is being followed by Rheumatology very closely.  I think the best way to go is to repeat the lupus anticoagulant screen in 3 months.  If it is still positive, then we might want to get her on baby aspirin as a preventive type measure.  She is very nice.  I enjoyed talking to her.  I just feel that she has all these health issues.

## 2022-01-25 ENCOUNTER — Ambulatory Visit: Payer: 59 | Admitting: Gastroenterology

## 2022-01-28 ENCOUNTER — Ambulatory Visit (HOSPITAL_BASED_OUTPATIENT_CLINIC_OR_DEPARTMENT_OTHER)
Admission: RE | Admit: 2022-01-28 | Discharge: 2022-01-28 | Disposition: A | Discharge status: Home / Self Care | Payer: 59 | Source: Ambulatory Visit | Attending: Family | Admitting: Family

## 2022-01-28 ENCOUNTER — Other Ambulatory Visit: Payer: Self-pay

## 2022-01-28 DIAGNOSIS — M5416 Radiculopathy, lumbar region: Secondary | ICD-10-CM | POA: Insufficient documentation

## 2022-01-30 ENCOUNTER — Other Ambulatory Visit: Payer: Self-pay | Admitting: Family

## 2022-01-30 ENCOUNTER — Encounter: Payer: Self-pay | Admitting: Family

## 2022-01-30 ENCOUNTER — Other Ambulatory Visit: Payer: Self-pay | Admitting: Gastroenterology

## 2022-01-30 DIAGNOSIS — G8929 Other chronic pain: Secondary | ICD-10-CM

## 2022-01-30 DIAGNOSIS — M545 Low back pain, unspecified: Secondary | ICD-10-CM

## 2022-02-01 ENCOUNTER — Telehealth: Payer: Self-pay | Admitting: Family

## 2022-02-01 NOTE — Telephone Encounter (Signed)
[facility] called stating the  paperwork faxed over to them (from 01/20 OV) did not contain the 61 pages it said it was supposed to have. She would like to make sure everything was faxed over and also any imaging from the patient. It can be faxed to [phone]. Please advise.

## 2022-02-06 NOTE — Telephone Encounter (Signed)
Spoke to [place] and she confirmed both faxed have been received.

## 2022-02-06 NOTE — Telephone Encounter (Signed)
[facility] is calling back regarding the 61 pages that they were supposed to receive but only ended up getting 5 of them. She would like to know if she was supposed to get all 61 pages or if the 5 is what was meant to be sent. She also stated she did not received the MRI faxed to them. Fax number was verified [phone].  She stated the patient has an appointment on Feb. 22nd and they need the documentation. She can be reached at [phone] for questions. Please advise.

## 2022-02-06 NOTE — Telephone Encounter (Signed)
Faxed sent for MRI and 2nd fax sent with last 2 ov notes 5 and 27 pages.

## 2022-02-06 NOTE — Telephone Encounter (Signed)
MRI results faxed.

## 2022-02-07 ENCOUNTER — Ambulatory Visit (INDEPENDENT_AMBULATORY_CARE_PROVIDER_SITE_OTHER)
Admission: RE | Admit: 2022-02-07 | Discharge: 2022-02-07 | Disposition: A | Discharge status: Home / Self Care | Payer: 59 | Source: Ambulatory Visit | Attending: Pulmonary Disease | Admitting: Pulmonary Disease

## 2022-02-07 ENCOUNTER — Other Ambulatory Visit: Payer: Self-pay | Admitting: Family

## 2022-02-07 ENCOUNTER — Other Ambulatory Visit: Payer: Self-pay

## 2022-02-07 ENCOUNTER — Encounter: Payer: Self-pay | Admitting: Internal Medicine

## 2022-02-07 ENCOUNTER — Ambulatory Visit (INDEPENDENT_AMBULATORY_CARE_PROVIDER_SITE_OTHER): Payer: 59 | Admitting: Family

## 2022-02-07 ENCOUNTER — Encounter: Payer: Self-pay | Admitting: Family

## 2022-02-07 VITALS — BP 128/83 | HR 75 | Temp 97.6°F | Resp 16 | Ht 63.0 in | Wt 218.8 lb

## 2022-02-07 DIAGNOSIS — L304 Erythema intertrigo: Secondary | ICD-10-CM

## 2022-02-07 DIAGNOSIS — R911 Solitary pulmonary nodule: Secondary | ICD-10-CM | POA: Diagnosis not present

## 2022-02-07 MED ORDER — DOXYCYCLINE HYCLATE 100 MG PO TABS: 100.0000 mg | ORAL_TABLET | Freq: Two times a day (BID) | ORAL | 0 refills | Status: DC

## 2022-02-07 MED ORDER — NYSTATIN 100000 UNIT/GM EX POWD: 1.0000 "application " | Freq: Three times a day (TID) | CUTANEOUS | 1 refills | Status: DC

## 2022-02-07 MED ORDER — FLUCONAZOLE 150 MG PO TABS: ORAL_TABLET | ORAL | 0 refills | Status: DC

## 2022-02-07 NOTE — Patient Instructions (Signed)
Please start doxycycline (antibiotic). Start diflucan once weekly for 3 weeks. Apply nystatin powder twice daily under and between breasts.  Call if symptoms worsen or if not improved in 1 week.

## 2022-02-07 NOTE — Telephone Encounter (Signed)
Please contact pt to schedule an OV.  Virtual is OK.

## 2022-02-07 NOTE — Telephone Encounter (Signed)
Patient has been scheduled

## 2022-02-07 NOTE — Telephone Encounter (Signed)
Requesting:[facility] Contract: UDS: Last Visit: Next Visit: Last Refill:  Please Advise

## 2022-02-07 NOTE — Assessment & Plan Note (Signed)
Uncontrolled. Pt is advised as follows:  Please start doxycycline (antibiotic). Start diflucan once weekly for 3 weeks. Apply nystatin powder twice daily under and between breasts.  Call if symptoms worsen or if not improved in 1 week.

## 2022-02-07 NOTE — Progress Notes (Signed)
Subjective:     Patient ID: Wanda Mays, female    DOB: 04/08/1976, 46 y.o.   MRN: [id number]  Chief Complaint  Patient presents with   Rash    Here for rash     Rash  Patient is in today for with chief complaint of rash beneath her breasts. She has had the rash for approximately 1 month. She has been applying nystatin ointment. Rash is worsening.   Health Maintenance Due  Topic Date Due   COVID-19 Vaccine (1) Never done    Past Medical History:  Diagnosis Date   ADD (attention deficit disorder)    Anemia    Arthritis    Asthma    Bipolar 1 disorder (HCC)    DDD (degenerative disc disease), lumbar    DDD (degenerative disc disease), thoracic    Dyspnea    Endometriosis    Fibroid    GERD (gastroesophageal reflux disease)    Hiatal hernia    Hypercholesteremia    Hypertension    Hyperthyroidism    Lung nodule seen on imaging study    OCD (obsessive compulsive disorder)    Pneumonia     Past Surgical History:  Procedure Laterality Date   ABDOMINAL HYSTERECTOMY     BREAST BIOPSY Left 11/23/2021   BREAST BIOPSY Left 12/28/2021   CESAREAN SECTION     CHOLECYSTECTOMY     COLONOSCOPY     around 2007 [place] did have polyps   LAPAROSCOPIC ABDOMINAL EXPLORATION     UPPER GASTROINTESTINAL ENDOSCOPY      Family History  Problem Relation Age of Onset   Clotting disorder Mother        TIA's   Diabetes type II Mother    Hypertension Mother    Hypertension Father    Diabetes type II Father    Heart Problems Father    Hypertension Sister    Diabetes type II Sister    Heart murmur Brother    Asthma Brother    Asthma Daughter    ADD / ADHD Daughter    ADD / ADHD Son    Asthma Son    Cancer Paternal Uncle    Atrial fibrillation Maternal Grandmother    Cancer Maternal Grandfather    Cancer Paternal Grandfather    Diabetes Mellitus I Paternal Grandfather    Heart Problems Paternal Grandfather    Lung cancer Paternal Grandfather    Colon cancer Neg  Hx    Rectal cancer Neg Hx    Stomach cancer Neg Hx     Social History   Socioeconomic History   Marital status: Married    Spouse name: Harrell Gave   Number of children: 5   Years of education: Not on file   Highest education level: Not on file  Occupational History   Not on file  Tobacco Use   Smoking status: Every Day    Packs/day: 0.25    Years: 1.00    Pack years: 0.25    Types: Cigarettes    Passive exposure: Current   Smokeless tobacco: Never   Tobacco comments:    3 a day as of 01/24/22  Vaping Use   Vaping Use: Former  Substance and Sexual Activity   Alcohol use: Not Currently   Drug use: Not Currently   Sexual activity: Not Currently    Birth control/protection: Surgical    Comment: Partial hysterectomy  Other Topics Concern   Not on file  Social History Narrative   Patient is  currently unemployed   CMA, has been doing customer service   Married   5 children   [place]   2006 [place]   2010- [place] (son)      2 children local and 3 in [place]      Enjoys crafts, playing with kids, plays piano   1 cat and 1 dog   Completed associates degree   Social Determinants of Radio broadcast assistant Strain: Not on file  Food Insecurity: Not on file  Transportation Needs: Not on file  Physical Activity: Inactive   Days of Exercise per Week: 0 days   Minutes of Exercise per Session: 0 min  Stress: Not on file  Social Connections: Not on file  Intimate Partner Violence: Not on file    Outpatient Medications Prior to Visit  Medication Sig Dispense Refill   ADVAIR DISKUS 250-50 MCG/ACT AEPB Inhale 1 puff into the lungs 2 (two) times daily. 60 each 2   albuterol (VENTOLIN HFA) 108 (90 Base) MCG/ACT inhaler Inhale 2 puffs into the lungs every 6 (six) hours as needed for wheezing or shortness of breath. 18 g 0   buPROPion (WELLBUTRIN SR) 150 MG 12 hr tablet TAKE 1 TABLET BY MOUTH TWICE A DAY 180 tablet 1   DULoxetine  (CYMBALTA) 30 MG capsule Take by mouth.     DULoxetine (CYMBALTA) 60 MG capsule Take 90 mg by mouth daily.     gabapentin (NEURONTIN) 100 MG capsule Take 1 capsule (100 mg total) by mouth 3 (three) times daily. 90 capsule 3   hydrochlorothiazide (HYDRODIURIL) 25 MG tablet TAKE 1 TABLET (25 MG TOTAL) BY MOUTH DAILY. 90 tablet 1   hydroxychloroquine (PLAQUENIL) 200 MG tablet Take 1 tablet (200 mg total) by mouth daily. 30 tablet 2   hydrOXYzine (ATARAX) 25 MG tablet TAKE 1 TABLET BY MOUTH 3 TIMES DAILY AS NEEDED FOR ANXIETY. 42 tablet 0   hyoscyamine (LEVSIN SL) 0.125 MG SL tablet PLACE 1 TABLET UNDER THE TONGUE EVERY 6 HOURS AS NEEDED FOR CRAMPING. 30 tablet 1   ipratropium-albuterol (DUONEB) 0.5-2.5 (3) MG/3ML SOLN Take 3 mLs by nebulization every 4 (four) hours as needed. 72 mL 1   LATUDA 20 MG TABS tablet SMARTSIG:1 Tablet(s) By Mouth Every Evening     LORazepam (ATIVAN) 1 MG tablet One tablet by mouth once, 30 minutes prior to MRI- do not drive after taking. 1 tablet 0   meclizine (ANTIVERT) 25 MG tablet TAKE 1 TABLET BY MOUTH 3 TIMES A DAY AS NEEDED FOR DIZZINESS 30 tablet 0   methocarbamol (ROBAXIN) 500 MG tablet TAKE 1 TABLET BY MOUTH EVERY 8 HOURS AS NEEDED FOR MUSCLE SPASM 30 tablet 0   omeprazole (PRILOSEC) 40 MG capsule TAKE 1 CAPSULE (40 MG TOTAL) BY MOUTH 2 (TWO) TIMES DAILY. PRILOSEC 40 MG TWICE DAILY FOR 6 WEEKS, THEN REDUCE TO 40 MG DAILY AND TITRATE TO LOWEST EFFECTIVE DOSE TO CONTROL REFLUX. 180 capsule 1   ondansetron (ZOFRAN) 4 MG tablet Take 1 tablet (4 mg total) by mouth every 8 (eight) hours as needed for nausea or vomiting. 30 tablet 1   ondansetron (ZOFRAN) 4 MG tablet Take by mouth.     ondansetron (ZOFRAN) 4 MG tablet TAKE 1 TABLET BY MOUTH EVERY 8 HOURS AS NEEDED FOR NAUSEA AND VOMITING 9 tablet 6   potassium chloride (KLOR-CON) 10 MEQ tablet Take 1 tablet (10 mEq total) by mouth daily. 90 tablet 0  propranolol (INDERAL) 40 MG tablet TAKE 1 TABLET (40 MG TOTAL) BY MOUTH  EVERY 8 (EIGHT) HOURS. 270 tablet 3   propranolol (INDERAL) 60 MG tablet Take 60 mg by mouth 2 (two) times daily as needed.     traZODone (DESYREL) 100 MG tablet TAKE 1-2 TABLETS (100-200 MG TOTAL) BY MOUTH AT BEDTIME AS NEEDED. 90 tablet 0   nystatin cream (MYCOSTATIN) Apply 1 application topically 2 (two) times daily. 30 g 0   No facility-administered medications prior to visit.    Allergies  Allergen Reactions   Penicillins Hives   Aspirin     Review of Systems  Skin:  Positive for rash.      Objective:    Physical Exam Constitutional:      Appearance: Normal appearance.  Skin:    Findings: Rash (erythematous raw appearing rash noted between and beneath both breasts) present.  Neurological:     Mental Status: She is alert and oriented to person, place, and time.  Psychiatric:        Mood and Affect: Mood normal.        Behavior: Behavior normal.        Thought Content: Thought content normal.        Judgment: Judgment normal.    BP 128/83 (BP Location: Right Arm, Patient Position: Sitting, Cuff Size: Normal)    Pulse 75    Temp 97.6 F (36.4 C) (Oral)    Resp 16    Ht 5\' 3"  (1.6 m)    Wt 218 lb 12.8 oz (99.2 kg)    SpO2 99%    BMI 38.76 kg/m  Wt Readings from Last 3 Encounters:  02/07/22 218 lb 12.8 oz (99.2 kg)  01/24/22 216 lb 0.6 oz (98 kg)  01/18/22 221 lb 9.6 oz (100.5 kg)       Assessment & Plan:   Problem List Items Addressed This Visit       Unprioritized   [place] - Primary    Uncontrolled. Pt is advised as follows:  Please start doxycycline (antibiotic). Start diflucan once weekly for 3 weeks. Apply nystatin powder twice daily under and between breasts.  Call if symptoms worsen or if not improved in 1 week.        I have discontinued Sharni R. Olivares's nystatin cream. I am also having her start on nystatin, fluconazole, and doxycycline. Additionally, I am having her maintain her albuterol, DULoxetine, ipratropium-albuterol, gabapentin,  ondansetron, Advair Diskus, potassium chloride, propranolol, DULoxetine, Latuda, ondansetron, buPROPion, propranolol, meclizine, hydrOXYzine, hydrochlorothiazide, methocarbamol, traZODone, hydroxychloroquine, LORazepam, ondansetron, hyoscyamine, and omeprazole.  Meds ordered this encounter  Medications   nystatin (MYCOSTATIN/NYSTOP) powder    Sig: Apply 1 application topically 3 (three) times daily.    Dispense:  60 g    Refill:  1    Order Specific Question:   Supervising Provider    Answer:   Penni Homans A [[id number]]   fluconazole (DIFLUCAN) 150 MG tablet    Sig: Take 1 tablet by mouth once weekly for 3 weeks    Dispense:  3 tablet    Refill:  0    Order Specific Question:   Supervising Provider    Answer:   Penni Homans A [[id number]]   doxycycline (VIBRA-TABS) 100 MG tablet    Sig: Take 1 tablet (100 mg total) by mouth 2 (two) times daily.    Dispense:  14 tablet    Refill:  0    Order Specific Question:   Supervising Provider  Answer:   Penni Homans A [id number]

## 2022-02-08 ENCOUNTER — Other Ambulatory Visit: Payer: Self-pay | Admitting: Family

## 2022-02-08 DIAGNOSIS — L304 Erythema intertrigo: Secondary | ICD-10-CM

## 2022-02-10 ENCOUNTER — Ambulatory Visit (INDEPENDENT_AMBULATORY_CARE_PROVIDER_SITE_OTHER): Payer: 59 | Admitting: Plastic Surgery

## 2022-02-10 ENCOUNTER — Encounter: Payer: Self-pay | Admitting: Plastic Surgery

## 2022-02-10 ENCOUNTER — Ambulatory Visit: Payer: 59 | Admitting: Family

## 2022-02-10 ENCOUNTER — Other Ambulatory Visit: Payer: Self-pay

## 2022-02-10 VITALS — BP 104/64 | HR 82 | Ht 63.0 in | Wt 221.4 lb

## 2022-02-10 DIAGNOSIS — F319 Bipolar disorder, unspecified: Secondary | ICD-10-CM

## 2022-02-10 DIAGNOSIS — M546 Pain in thoracic spine: Secondary | ICD-10-CM

## 2022-02-10 DIAGNOSIS — M542 Cervicalgia: Secondary | ICD-10-CM | POA: Insufficient documentation

## 2022-02-10 DIAGNOSIS — N62 Hypertrophy of breast: Secondary | ICD-10-CM | POA: Diagnosis not present

## 2022-02-10 DIAGNOSIS — M549 Dorsalgia, unspecified: Secondary | ICD-10-CM | POA: Insufficient documentation

## 2022-02-10 DIAGNOSIS — R760 Raised antibody titer: Secondary | ICD-10-CM

## 2022-02-10 DIAGNOSIS — G8929 Other chronic pain: Secondary | ICD-10-CM

## 2022-02-10 NOTE — Progress Notes (Addendum)
Patient ID: Wanda Mays, female    DOB: 1976-07-06, 46 y.o.   MRN: [id number]   Chief Complaint  Patient presents with   Advice Only   Breast Problem    Mammary Hyperplasia: The patient is a 46 y.o. female with a history of mammary hyperplasia for several years.  She has extremely large breasts causing symptoms that include the following: Back pain in the upper and lower back, including neck pain. She pulls or pins her bra straps to provide better lift and relief of the pressure and pain. She notices relief by holding her breast up manually.  Her shoulder straps cause grooves and pain and pressure that requires padding for relief. Pain medication is sometimes required with motrin and tylenol.  Activities that are hindered by enlarged breasts include: exercise and running.  She has tried supportive clothing as well as fitted bras without improvement.  Her breasts are extremely large and fairly symmetric.  She has hyperpigmentation of the inframammary area on both sides.  The sternal to nipple distance on the right is 36 cm and the left is 36 cm.  The IMF distance is 14 cm.  She is 5 feet 3 inches tall and weighs 221 pounds.  The BMI = 39.1 kg/m.  Preoperative bra size = F cup.  The estimated excess breast tissue to be removed at the time of surgery = 640 grams on the left and 640 grams on the right.  Mammogram history: 1/23 negative.  Family history of breast cancer:  no.  Tobacco use:  yes.   The patient expresses the desire to pursue surgical intervention.  She has a terrible rash on the breasts.  She has been on oral and topical cream.  I can see from her referral that there were some concerns about bloody nipple discharge from the left breast and the possibility of a duct excision in coordination with Dr. Marlou Starks.   Review of Systems  Constitutional: Negative.   HENT: Negative.    Eyes: Negative.   Respiratory: Negative.  Negative for chest tightness and shortness of breath.    Cardiovascular: Negative.  Negative for leg swelling.  Gastrointestinal: Negative.   Endocrine: Negative.   Genitourinary: Negative.   Musculoskeletal:  Positive for back pain and neck pain.  Skin:  Positive for color change and rash.  Hematological: Negative.   Psychiatric/Behavioral: Negative.     Past Medical History:  Diagnosis Date   ADD (attention deficit disorder)    Anemia    Arthritis    Asthma    Bipolar 1 disorder (HCC)    DDD (degenerative disc disease), lumbar    DDD (degenerative disc disease), thoracic    Dyspnea    Endometriosis    Fibroid    GERD (gastroesophageal reflux disease)    Hiatal hernia    Hypercholesteremia    Hypertension    Hyperthyroidism    Lung nodule seen on imaging study    OCD (obsessive compulsive disorder)    Pneumonia     Past Surgical History:  Procedure Laterality Date   ABDOMINAL HYSTERECTOMY     BREAST BIOPSY Left 11/23/2021   BREAST BIOPSY Left 12/28/2021   CESAREAN SECTION     CHOLECYSTECTOMY     COLONOSCOPY     around 2007 Tressie Ellis Flordia did have polyps   LAPAROSCOPIC ABDOMINAL EXPLORATION     UPPER GASTROINTESTINAL ENDOSCOPY        Current Outpatient Medications:    ADVAIR DISKUS 250-50 MCG/ACT AEPB,  Inhale 1 puff into the lungs 2 (two) times daily., Disp: 60 each, Rfl: 2   albuterol (VENTOLIN HFA) 108 (90 Base) MCG/ACT inhaler, Inhale 2 puffs into the lungs every 6 (six) hours as needed for wheezing or shortness of breath., Disp: 18 g, Rfl: 0   buPROPion (WELLBUTRIN SR) 150 MG 12 hr tablet, TAKE 1 TABLET BY MOUTH TWICE A DAY, Disp: 180 tablet, Rfl: 1   doxycycline (VIBRA-TABS) 100 MG tablet, Take 1 tablet (100 mg total) by mouth 2 (two) times daily., Disp: 14 tablet, Rfl: 0   DULoxetine (CYMBALTA) 30 MG capsule, Take by mouth., Disp: , Rfl:    DULoxetine (CYMBALTA) 60 MG capsule, Take 90 mg by mouth daily., Disp: , Rfl:    fluconazole (DIFLUCAN) 150 MG tablet, Take 1 tablet by mouth once weekly for 3 weeks, Disp: 3  tablet, Rfl: 0   gabapentin (NEURONTIN) 100 MG capsule, Take 1 capsule (100 mg total) by mouth 3 (three) times daily., Disp: 90 capsule, Rfl: 3   hydrochlorothiazide (HYDRODIURIL) 25 MG tablet, TAKE 1 TABLET (25 MG TOTAL) BY MOUTH DAILY., Disp: 90 tablet, Rfl: 1   hydroxychloroquine (PLAQUENIL) 200 MG tablet, Take 1 tablet (200 mg total) by mouth daily., Disp: 30 tablet, Rfl: 2   hydrOXYzine (ATARAX) 25 MG tablet, TAKE 1 TABLET BY MOUTH 3 TIMES DAILY AS NEEDED FOR ANXIETY., Disp: 42 tablet, Rfl: 0   hyoscyamine (LEVSIN SL) 0.125 MG SL tablet, PLACE 1 TABLET UNDER THE TONGUE EVERY 6 HOURS AS NEEDED FOR CRAMPING., Disp: 30 tablet, Rfl: 1   ipratropium-albuterol (DUONEB) 0.5-2.5 (3) MG/3ML SOLN, Take 3 mLs by nebulization every 4 (four) hours as needed., Disp: 72 mL, Rfl: 1   LATUDA 20 MG TABS tablet, SMARTSIG:1 Tablet(s) By Mouth Every Evening, Disp: , Rfl:    LORazepam (ATIVAN) 1 MG tablet, One tablet by mouth once, 30 minutes prior to MRI- do not drive after taking., Disp: 1 tablet, Rfl: 0   meclizine (ANTIVERT) 25 MG tablet, TAKE 1 TABLET BY MOUTH 3 TIMES A DAY AS NEEDED FOR DIZZINESS, Disp: 30 tablet, Rfl: 0   methocarbamol (ROBAXIN) 500 MG tablet, TAKE 1 TABLET BY MOUTH EVERY 8 HOURS AS NEEDED FOR MUSCLE SPASM, Disp: 30 tablet, Rfl: 0   nystatin (MYCOSTATIN/NYSTOP) powder, Apply 1 application topically 3 (three) times daily., Disp: 60 g, Rfl: 1   omeprazole (PRILOSEC) 40 MG capsule, TAKE 1 CAPSULE (40 MG TOTAL) BY MOUTH 2 (TWO) TIMES DAILY. PRILOSEC 40 MG TWICE DAILY FOR 6 WEEKS, THEN REDUCE TO 40 MG DAILY AND TITRATE TO LOWEST EFFECTIVE DOSE TO CONTROL REFLUX., Disp: 180 capsule, Rfl: 1   ondansetron (ZOFRAN) 4 MG tablet, Take 1 tablet (4 mg total) by mouth every 8 (eight) hours as needed for nausea or vomiting., Disp: 30 tablet, Rfl: 1   ondansetron (ZOFRAN) 4 MG tablet, Take by mouth., Disp: , Rfl:    ondansetron (ZOFRAN) 4 MG tablet, TAKE 1 TABLET BY MOUTH EVERY 8 HOURS AS NEEDED FOR NAUSEA AND  VOMITING, Disp: 9 tablet, Rfl: 6   potassium chloride (KLOR-CON) 10 MEQ tablet, Take 1 tablet (10 mEq total) by mouth daily., Disp: 90 tablet, Rfl: 0   propranolol (INDERAL) 40 MG tablet, TAKE 1 TABLET (40 MG TOTAL) BY MOUTH EVERY 8 (EIGHT) HOURS., Disp: 270 tablet, Rfl: 3   propranolol (INDERAL) 60 MG tablet, Take 60 mg by mouth 2 (two) times daily as needed., Disp: , Rfl:    traZODone (DESYREL) 100 MG tablet, TAKE 1-2 TABLETS (100-200 MG  TOTAL) BY MOUTH AT BEDTIME AS NEEDED., Disp: 90 tablet, Rfl: 0   Objective:   Vitals:   02/10/22 0854  BP: 104/64  Pulse: 82  SpO2: 95%    Physical Exam Vitals reviewed.  Constitutional:      Appearance: Normal appearance.  HENT:     Head: Normocephalic and atraumatic.  Eyes:     Pupils: Pupils are equal, round, and reactive to light.  Cardiovascular:     Rate and Rhythm: Normal rate.     Pulses: Normal pulses.  Pulmonary:     Effort: Pulmonary effort is normal.  Abdominal:     General: There is no distension.     Palpations: Abdomen is soft.  Musculoskeletal:        General: No swelling or deformity.  Skin:    General: Skin is warm.     Capillary Refill: Capillary refill takes less than 2 seconds.     Coloration: Skin is not jaundiced.     Findings: Erythema and lesion present. No bruising.  Neurological:     Mental Status: She is alert and oriented to person, place, and time.  Psychiatric:        Mood and Affect: Mood normal.        Behavior: Behavior normal.        Thought Content: Thought content normal.        Judgment: Judgment normal.    Assessment & Plan:  Bipolar 1 disorder (HCC)  Lupus anticoagulant positive  Symptomatic mammary hypertrophy  Chronic bilateral thoracic back pain  Neck pain The procedure the patient selected and that was best for the patient was discussed. The risk were discussed and include but not limited to the following:  Breast asymmetry, fluid accumulation, firmness of the breast, inability to  breast feed, loss of nipple or areola, skin loss, change in skin and nipple sensation, fat necrosis of the breast tissue, bleeding, infection and healing delay.  There are risks of anesthesia and injury to nerves or blood vessels.  Allergic reaction to tape, suture and skin glue are possible.  There will be swelling.  Any of these can lead to the need for revisional surgery.  A breast reduction has potential to interfere with diagnostic procedures in the future.  This procedure is best done when the breast is fully developed.  Changes in the breast will continue to occur over time: pregnancy, weight gain or weigh loss.    Total time: 40 minutes. This includes time spent with the patient during the visit as well as time spent before and after the visit reviewing the chart, documenting the encounter and ordering pertinent studies. and literature emailed to the patient.   Physical therapy:  ordered Mammogram:  done  She will need to be tobacco free for 3 months. Plan for bilateral breast reduction with possible liposuction. I have spoken with Dr. Marlou Starks for coordination.  If I do an amputation on the left breast that would likely resolve the issue.    Pictures were obtained of the patient and placed in the chart with the patient's or guardian's permission.   [place], DO

## 2022-02-11 ENCOUNTER — Encounter: Payer: Self-pay | Admitting: Family

## 2022-02-11 ENCOUNTER — Telehealth: Payer: 59 | Admitting: Nurse Practitioner

## 2022-02-11 DIAGNOSIS — G43109 Migraine with aura, not intractable, without status migrainosus: Secondary | ICD-10-CM

## 2022-02-11 MED ORDER — SUMATRIPTAN SUCCINATE 50 MG PO TABS: 50.0000 mg | ORAL_TABLET | ORAL | 0 refills | Status: DC | PRN

## 2022-02-11 NOTE — Patient Instructions (Signed)
Migraine Headache A migraine headache is a very strong throbbing pain on one side or both sides of your head. This type of headache can also cause other symptoms. It can last from 4 hours to 3 days. Talk with your doctor about what things may bring on (trigger) this condition. What are the causes? The exact cause of this condition is not known. This condition may be triggered or caused by: Drinking alcohol. Smoking. Taking medicines, such as: Medicine used to treat chest pain (nitroglycerin). Birth control pills. Estrogen. Some blood pressure medicines. Eating or drinking certain products. Doing physical activity. Other things that may trigger a migraine headache include: Having a menstrual period. Pregnancy. Hunger. Stress. Not getting enough sleep or getting too much sleep. Weather changes. Tiredness (fatigue). What increases the risk? Being 25-55 years old. Being female. Having a family history of migraine headaches. Being Caucasian. Having depression or anxiety. Being very overweight. What are the signs or symptoms? A throbbing pain. This pain may: Happen in any area of the head, such as on one side or both sides. Make it hard to do daily activities. Get worse with physical activity. Get worse around bright lights or loud noises. Other symptoms may include: Feeling sick to your stomach (nauseous). Vomiting. Dizziness. Being sensitive to bright lights, loud noises, or smells. Before you get a migraine headache, you may get warning signs (an aura). An aura may include: Seeing flashing lights or having blind spots. Seeing bright spots, halos, or zigzag lines. Having tunnel vision or blurred vision. Having numbness or a tingling feeling. Having trouble talking. Having weak muscles. Some people have symptoms after a migraine headache (postdromal phase), such as: Tiredness. Trouble thinking (concentrating). How is this treated? Taking medicines that: Relieve  pain. Relieve the feeling of being sick to your stomach. Prevent migraine headaches. Treatment may also include: Having acupuncture. Avoiding foods that bring on migraine headaches. Learning ways to control your body functions (biofeedback). Therapy to help you know and deal with negative thoughts (cognitive behavioral therapy). Follow these instructions at home: Medicines Take over-the-counter and prescription medicines only as told by your doctor. Ask your doctor if the medicine prescribed to you: Requires you to avoid driving or using heavy machinery. Can cause trouble pooping (constipation). You may need to take these steps to prevent or treat trouble pooping: Drink enough fluid to keep your pee (urine) pale yellow. Take over-the-counter or prescription medicines. Eat foods that are high in fiber. These include beans, whole grains, and fresh fruits and vegetables. Limit foods that are high in fat and sugar. These include fried or sweet foods. Lifestyle Do not drink alcohol. Do not use any products that contain nicotine or tobacco, such as cigarettes, e-cigarettes, and chewing tobacco. If you need help quitting, ask your doctor. Get at least 8 hours of sleep every night. Limit and deal with stress. General instructions   Keep a journal to find out what may bring on your migraine headaches. For example, write down: What you eat and drink. How much sleep you get. Any change in what you eat or drink. Any change in your medicines. If you have a migraine headache: Avoid things that make your symptoms worse, such as bright lights. It may help to lie down in a dark, quiet room. Do not drive or use heavy machinery. Ask your doctor what activities are safe for you. Keep all follow-up visits as told by your doctor. This is important. Contact a doctor if: You get a migraine headache that   is different or worse than others you have had. You have more than 15 headache days in one  month. Get help right away if: Your migraine headache gets very bad. Your migraine headache lasts longer than 72 hours. You have a fever. You have a stiff neck. You have trouble seeing. Your muscles feel weak or like you cannot control them. You start to lose your balance a lot. You start to have trouble walking. You pass out (faint). You have a seizure. Summary A migraine headache is a very strong throbbing pain on one side or both sides of your head. These headaches can also cause other symptoms. This condition may be treated with medicines and changes to your lifestyle. Keep a journal to find out what may bring on your migraine headaches. Contact a doctor if you get a migraine headache that is different or worse than others you have had. Contact your doctor if you have more than 15 headache days in a month. This information is not intended to replace advice given to you by your health care provider. Make sure you discuss any questions you have with your health care provider. Document Revised: 04/04/2019 Document Reviewed: 01/23/2019 Elsevier Patient Education  2022 Else[employer] Inc[employer]  

## 2022-02-11 NOTE — Progress Notes (Signed)
Virtual Visit Consent   Wanda Mays, you are scheduled for a virtual visit with [name], [place], a [facility] provider, today.     Just as with appointments in the office, your consent must be obtained to participate.  Your consent will be active for this visit and any virtual visit you may have with one of our providers in the next 365 days.     If you have a MyChart account, a copy of this consent can be sent to you electronically.  All virtual visits are billed to your insurance company just like a traditional visit in the office.    As this is a virtual visit, video technology does not allow for your provider to perform a traditional examination.  This may limit your provider's ability to fully assess your condition.  If your provider identifies any concerns that need to be evaluated in person or the need to arrange testing (such as labs, EKG, etc.), we will make arrangements to do so.     Although advances in technology are sophisticated, we cannot ensure that it will always work on either your end or our end.  If the connection with a video visit is poor, the visit may have to be switched to a telephone visit.  With either a video or telephone visit, we are not always able to ensure that we have a secure connection.     I need to obtain your verbal consent now.   Are you willing to proceed with your visit today? YES   Wanda Mays has provided verbal consent on 02/11/2022 for a virtual visit (video or telephone).   [name], FNP   Date: 02/11/2022 9:40 AM   Virtual Visit via Video Note   I, [name], connected with Wanda Mays ([id number], October 28, 1976) on 02/11/22 at 10:15 AM EST by a video-enabled telemedicine application and verified that I am speaking with the correct person using two identifiers.  Location: Patient: Virtual Visit Location Patient: Home Provider: Virtual Visit Location Provider: Mobile   I discussed the limitations of  evaluation and management by telemedicine and the availability of in person appointments. The patient expressed understanding and agreed to proceed.    History of Present Illness: Wanda Mays is a 46 y.o. who identifies as a female who was assigned female at birth, and is being seen today for migraine.  HPI: Migraine  This is a new problem. The current episode started yesterday. The problem occurs constantly. The problem has been gradually worsening. The pain is located in the Right unilateral region. The pain radiates to the upper back. The quality of the pain is described as pulsating and sharp. The pain is at a severity of 7/10. The pain is mild. Associated symptoms include muscle aches, nausea and neck pain. Pertinent negatives include no blurred vision, fever, phonophobia or photophobia. The symptoms are aggravated by bright light, chewing and food. She has tried acetaminophen for the symptoms. The treatment provided no relief. Her past medical history is significant for migraine headaches.   Review of Systems  Constitutional:  Negative for fever.  Eyes:  Negative for blurred vision and photophobia.  Gastrointestinal:  Positive for nausea.  Musculoskeletal:  Positive for neck pain.   Problems:  Patient Active Problem List   Diagnosis Date Noted   Symptomatic mammary hypertrophy 02/10/2022   Back pain 02/10/2022   Neck pain 02/10/2022   Lupus anticoagulant positive 01/18/2022   Homocysteine level above reference range 01/18/2022  Preventative health care 01/13/2022   Lumbar radiculopathy 01/13/2022   Breast discharge 01/13/2022   Endometriosis determined by laparoscopy 11/16/2021   Positive ANA (antinuclear antibody) 11/16/2021   Intertrigo 10/11/2021   Axillary lymphadenopathy 10/11/2021   Arthralgia 10/11/2021   Myalgia 10/11/2021   Primary hypertension 10/11/2021   Hematochezia 09/15/2021   Hyperthyroidism 09/15/2021   Chest pain 09/15/2021   Lung nodule 09/14/2021    Bipolar 1 disorder ([place]) 08/25/2021   GERD (gastroesophageal reflux disease) 08/25/2021   Hypercholesteremia 08/25/2021   Degenerative disc disease, cervical 08/08/2021   Degenerative disc disease, lumbar 10/23/2012   Bronchitis 04/21/2011    Allergies:  Allergies  Allergen Reactions   Penicillins Hives   Aspirin    Medications:  Current Outpatient Medications:    ADVAIR DISKUS 250-50 MCG/ACT AEPB, Inhale 1 puff into the lungs 2 (two) times daily., Disp: 60 each, Rfl: 2   albuterol (VENTOLIN HFA) 108 (90 Base) MCG/ACT inhaler, Inhale 2 puffs into the lungs every 6 (six) hours as needed for wheezing or shortness of breath., Disp: 18 g, Rfl: 0   buPROPion (WELLBUTRIN SR) 150 MG 12 hr tablet, TAKE 1 TABLET BY MOUTH TWICE A DAY, Disp: 180 tablet, Rfl: 1   doxycycline (VIBRA-TABS) 100 MG tablet, Take 1 tablet (100 mg total) by mouth 2 (two) times daily., Disp: 14 tablet, Rfl: 0   DULoxetine (CYMBALTA) 30 MG capsule, Take by mouth., Disp: , Rfl:    DULoxetine (CYMBALTA) 60 MG capsule, Take 90 mg by mouth daily., Disp: , Rfl:    fluconazole (DIFLUCAN) 150 MG tablet, Take 1 tablet by mouth once weekly for 3 weeks, Disp: 3 tablet, Rfl: 0   gabapentin (NEURONTIN) 100 MG capsule, Take 1 capsule (100 mg total) by mouth 3 (three) times daily., Disp: 90 capsule, Rfl: 3   hydrochlorothiazide (HYDRODIURIL) 25 MG tablet, TAKE 1 TABLET (25 MG TOTAL) BY MOUTH DAILY., Disp: 90 tablet, Rfl: 1   hydroxychloroquine (PLAQUENIL) 200 MG tablet, Take 1 tablet (200 mg total) by mouth daily., Disp: 30 tablet, Rfl: 2   hydrOXYzine (ATARAX) 25 MG tablet, TAKE 1 TABLET BY MOUTH 3 TIMES DAILY AS NEEDED FOR ANXIETY., Disp: 42 tablet, Rfl: 0   hyoscyamine (LEVSIN SL) 0.125 MG SL tablet, PLACE 1 TABLET UNDER THE TONGUE EVERY 6 HOURS AS NEEDED FOR CRAMPING., Disp: 30 tablet, Rfl: 1   ipratropium-albuterol (DUONEB) 0.5-2.5 (3) MG/3ML SOLN, Take 3 mLs by nebulization every 4 (four) hours as needed., Disp: 72 mL, Rfl: 1    LATUDA 20 MG TABS tablet, SMARTSIG:1 Tablet(s) By Mouth Every Evening, Disp: , Rfl:    LORazepam (ATIVAN) 1 MG tablet, One tablet by mouth once, 30 minutes prior to MRI- do not drive after taking., Disp: 1 tablet, Rfl: 0   meclizine (ANTIVERT) 25 MG tablet, TAKE 1 TABLET BY MOUTH 3 TIMES A DAY AS NEEDED FOR DIZZINESS, Disp: 30 tablet, Rfl: 0   methocarbamol (ROBAXIN) 500 MG tablet, TAKE 1 TABLET BY MOUTH EVERY 8 HOURS AS NEEDED FOR MUSCLE SPASM, Disp: 30 tablet, Rfl: 0   nystatin (MYCOSTATIN/NYSTOP) powder, Apply 1 application topically 3 (three) times daily., Disp: 60 g, Rfl: 1   omeprazole (PRILOSEC) 40 MG capsule, TAKE 1 CAPSULE (40 MG TOTAL) BY MOUTH 2 (TWO) TIMES DAILY. PRILOSEC 40 MG TWICE DAILY FOR 6 WEEKS, THEN REDUCE TO 40 MG DAILY AND TITRATE TO LOWEST EFFECTIVE DOSE TO CONTROL REFLUX., Disp: 180 capsule, Rfl: 1   ondansetron (ZOFRAN) 4 MG tablet, Take 1 tablet (4 mg total) by  mouth every 8 (eight) hours as needed for nausea or vomiting., Disp: 30 tablet, Rfl: 1   ondansetron (ZOFRAN) 4 MG tablet, Take by mouth., Disp: , Rfl:    ondansetron (ZOFRAN) 4 MG tablet, TAKE 1 TABLET BY MOUTH EVERY 8 HOURS AS NEEDED FOR NAUSEA AND VOMITING, Disp: 9 tablet, Rfl: 6   potassium chloride (KLOR-CON) 10 MEQ tablet, Take 1 tablet (10 mEq total) by mouth daily., Disp: 90 tablet, Rfl: 0   propranolol (INDERAL) 40 MG tablet, TAKE 1 TABLET (40 MG TOTAL) BY MOUTH EVERY 8 (EIGHT) HOURS., Disp: 270 tablet, Rfl: 3   propranolol (INDERAL) 60 MG tablet, Take 60 mg by mouth 2 (two) times daily as needed., Disp: , Rfl:    traZODone (DESYREL) 100 MG tablet, TAKE 1-2 TABLETS (100-200 MG TOTAL) BY MOUTH AT BEDTIME AS NEEDED., Disp: 90 tablet, Rfl: 0  Observations/Objective: Patient is well-developed, well-nourished in no acute distress.  Resting comfortably  at home.  Head is normocephalic, atraumatic.  No labored breathing.  Speech is clear and coherent with logical content.  Patient is alert and oriented at  baseline.  Patient holding th eside of her head whie talking  Assessment and Plan:  Wanda Mays in today with chief complaint of Migraine   1. Migraine with aura and without status migrainosus, not intractable Rest in dark room Cool compress to area of pain Avid caffeine  Meds ordered this encounter  Medications   SUMAtriptan (IMITREX) 50 MG tablet    Sig: Take 1 tablet (50 mg total) by mouth every 2 (two) hours as needed for migraine. May repeat in 2 hours if headache persists or recurs.    Dispense:  10 tablet    Refill:  0    Order Specific Question:   Supervising Provider    Answer:   Noemi Chapel [[id number]]        Follow Up Instructions: I discussed the assessment and treatment plan with the patient. The patient was provided an opportunity to ask questions and all were answered. The patient agreed with the plan and demonstrated an understanding of the instructions.  A copy of instructions were sent to the patient via MyChart.  The patient was advised to call back or seek an in-person evaluation if the symptoms worsen or if the condition fails to improve as anticipated.  Time:  I spent 9 minutes with the patient via telehealth technology discussing the above problems/concerns.    [name], FNP

## 2022-02-13 ENCOUNTER — Encounter: Payer: Self-pay | Admitting: Plastic Surgery

## 2022-02-13 NOTE — Telephone Encounter (Signed)
Pt had a virtual visit with another provider on 02/11/22 , medication sent in after visit

## 2022-02-14 ENCOUNTER — Telehealth: Payer: 59 | Admitting: Family

## 2022-02-16 ENCOUNTER — Encounter: Payer: Self-pay | Admitting: Pulmonary Disease

## 2022-02-16 DIAGNOSIS — R911 Solitary pulmonary nodule: Secondary | ICD-10-CM

## 2022-02-16 NOTE — Telephone Encounter (Signed)
Order has been placed for CT scan.  

## 2022-02-17 NOTE — Telephone Encounter (Signed)
Noted.  Mammogram is up to date.

## 2022-02-19 ENCOUNTER — Other Ambulatory Visit: Payer: Self-pay | Admitting: Family

## 2022-03-03 NOTE — Therapy (Signed)
OUTPATIENT PHYSICAL THERAPY CERVICAL EVALUATION   Patient Name: Wanda Mays MRN: [id number] DOB:11/26/1976, 46 y.o., female Today's Date: 03/06/2022   PT End of Session - 03/06/22 0824     Visit Number 1    Number of Visits 6    Date for PT Re-Evaluation 04/17/22    Authorization Type [employer]    PT Start Time 0830    PT Stop Time 0915    PT Time Calculation (min) 45 min    Activity Tolerance Patient tolerated treatment well    Behavior During Therapy WFL for tasks assessed/performed             Past Medical History:  Diagnosis Date   ADD (attention deficit disorder)    Anemia    Arthritis    Asthma    Bipolar 1 disorder ([place])    DDD (degenerative disc disease), lumbar    DDD (degenerative disc disease), thoracic    Dyspnea    Endometriosis    Fibroid    GERD (gastroesophageal reflux disease)    Hiatal hernia    Hypercholesteremia    Hypertension    Hyperthyroidism    Lung nodule seen on imaging study    OCD (obsessive compulsive disorder)    Pneumonia    Past Surgical History:  Procedure Laterality Date   ABDOMINAL HYSTERECTOMY     BREAST BIOPSY Left 11/23/2021   BREAST BIOPSY Left 12/28/2021   CESAREAN SECTION     CHOLECYSTECTOMY     COLONOSCOPY     around 2007 [place] did have polyps   LAPAROSCOPIC ABDOMINAL EXPLORATION     UPPER GASTROINTESTINAL ENDOSCOPY     Patient Active Problem List   Diagnosis Date Noted   Symptomatic mammary hypertrophy 02/10/2022   Back pain 02/10/2022   Neck pain 02/10/2022   Lupus anticoagulant positive 01/18/2022   Homocysteine level above reference range 01/18/2022   Preventative health care 01/13/2022   Lumbar radiculopathy 01/13/2022   Breast discharge 01/13/2022   Endometriosis determined by laparoscopy 11/16/2021   Positive ANA (antinuclear antibody) 11/16/2021   Intertrigo 10/11/2021   Axillary lymphadenopathy 10/11/2021   Arthralgia 10/11/2021   Myalgia 10/11/2021   Primary hypertension 10/11/2021    Hematochezia 09/15/2021   Hyperthyroidism 09/15/2021   Chest pain 09/15/2021   Lung nodule 09/14/2021   Bipolar 1 disorder ([place]) 08/25/2021   GERD (gastroesophageal reflux disease) 08/25/2021   Hypercholesteremia 08/25/2021   Degenerative disc disease, cervical 08/08/2021   Degenerative disc disease, lumbar 10/23/2012   Bronchitis 04/21/2011    PCP: [name], NP  REFERRING PROVIDER: Wallace Going, DO  REFERRING DIAG: Symptomatic mammary hypertrophy, Chronic bilateral thoracic back pain, Neck pain  THERAPY DIAG:  Cervicalgia - Plan: PT plan of care cert/re-cert  Pain in thoracic spine - Plan: PT plan of care cert/re-cert  Muscle weakness (generalized) - Plan: PT plan of care cert/re-cert  Abnormal posture - Plan: PT plan of care cert/re-cert  ONSET DATE: "all my life"  SUBJECTIVE:           SUBJECTIVE STATEMENT: Patient states she has arthritis up her back and recently her neck and shoulder area have been hurting. She states that sitting up straight can aggravate her pain. This has been going on her whole lift and has just recently gotten worse.   PERTINENT HISTORY:  Diagnosed with Lupus recently  PAIN:  Are you having pain? Yes NPRS scale: 3/10 (7/10 at worst) Pain location: Neck, upper back Pain orientation: Bilateral PAIN TYPE: Chronic Pain description:  Constant, dull Aggravating factors: Sitting up Relieving factors: Heat, ice  PRECAUTIONS: None  WEIGHT BEARING RESTRICTIONS No  FALLS:  Has patient fallen in last 6 months? Yes  LIVING ENVIRONMENT: Lives with: lives with their family  OCCUPATION: Unemployed  PLOF: Independent  PATIENT GOALS: Pain relief and improve posture   OBJECTIVE:  DIAGNOSTIC FINDINGS:  N/A  PATIENT SURVEYS:  NDI 27/50  COGNITION: Overall cognitive status: Within functional limits for tasks assessed   SENSATION: Light touch: Appears intact  POSTURE:  Rounded shoulders, forward  head  PALPATION: Tender to palpation for bilateral upper trap region with increased muscle tension   CERVICAL AROM  A/PROM AROM (deg) 03/06/2022  Flexion 50  Extension 35  Right lateral flexion 40*  Left lateral flexion 45  Right rotation 70  Left rotation 60*  *Patient reports left upper trap tightness  UE AROM/PROM:   Shoulder AROM grossly WFL and non-painful  UE MMT:  MMT Right 03/06/2022 Left 03/06/2022  Shoulder flexion 5 5  Shoulder extension 5 5  Shoulder abduction 5 5  Shoulder internal rotation 5 5  Shoulder external rotation 5 5  Periscapular musculature 4- 4-  Patient demonstrates upper trap compensation with all muscle testing  CERVICAL SPECIAL TESTS:  Radicular testing negative  TODAY'S TREATMENT:  Supine chin tuck with lift  5 x 10 sec Supine horizontal abduction with green x 10 Row with green x 10 Extension and scap retraction with green x 10  PATIENT EDUCATION:  Education details: Exam findings, POC, HEP, TPDN for future sessions Person educated: Patient Education method: Explanation, Demonstration, Tactile cues, Verbal cues, and Handouts Education comprehension: verbalized understanding, returned demonstration, verbal cues required, tactile cues required, and needs further education  HOME EXERCISE PROGRAM: Access Code: [email or web address]   ASSESSMENT: CLINICAL IMPRESSION: Patient is a 46 y.o. female who was seen today for physical therapy evaluation and treatment for neck and upper back pain secondary to postural strength and endurance deficits.    OBJECTIVE IMPAIRMENTS decreased strength, postural dysfunction, and pain.   ACTIVITY LIMITATIONS cleaning, community activity, driving, meal prep, occupation, laundry, yard work, and shopping.   PERSONAL FACTORS Past/current experiences and Time since onset of injury/illness/exacerbation are also affecting patient's functional outcome.    REHAB POTENTIAL: Good  CLINICAL DECISION MAKING:  Stable/uncomplicated  EVALUATION COMPLEXITY: Low   GOALS: Goals reviewed with patient? Yes  SHORT TERM GOALS:  Patient will be I with initial HEP in order to progress with therapy. Baseline: HEP provided at eval Target date: 03/27/2022 Goal status: INITIAL  2.  Patient will report pain level </= 3/10 with activity or sitting up extended periods in order to reduce functional limitation. Baseline: 7/10 pain Target date: 03/27/2022 Goal status: INITIAL  LONG TERM GOALS:  Patient will be I with final HEP to maintain progress from PT. Baseline: HEP provided at eval Target date: 04/17/2022 Goal status: INITIAL  2.  Patient will report NDI </= 17/50 in order to indicate improvement in functional ability. Baseline: NDI 27/50 Target date: 04/17/2022 Goal status: INITIAL  3.  Patient will demonstrate periscapular strength >/= 4/5 MMT in order to improve her postural control and reduce pain with activity. Baseline: Grossly 4-/5 MMT Target date: 04/17/2022 Goal status: INITIAL   PLAN: PT FREQUENCY: 1x/week  PT DURATION: 6 weeks  PLANNED INTERVENTIONS: Therapeutic exercises, Therapeutic activity, Neuromuscular re-education, Balance training, Gait training, Patient/Family education, Joint manipulation, Joint mobilization, Aquatic Therapy, Dry Needling, Electrical stimulation, Spinal manipulation, Spinal mobilization, Cryotherapy, Moist heat, Taping, and Manual therapy  PLAN  FOR NEXT SESSION: Review HEP and progress PRN, manual/dry needling for upper trap and cervical region, progress postural strength and endurance   Hilda Blades, PT, DPT, LAT, ATC 03/06/22  9:16 AM Phone: [phone] Fax: [phone]

## 2022-03-06 ENCOUNTER — Encounter: Payer: Self-pay | Admitting: Physical Therapy

## 2022-03-06 ENCOUNTER — Ambulatory Visit: Payer: 59 | Attending: Plastic Surgery | Admitting: Physical Therapy

## 2022-03-06 ENCOUNTER — Other Ambulatory Visit: Payer: Self-pay

## 2022-03-06 DIAGNOSIS — M542 Cervicalgia: Secondary | ICD-10-CM | POA: Insufficient documentation

## 2022-03-06 DIAGNOSIS — G8929 Other chronic pain: Secondary | ICD-10-CM | POA: Diagnosis not present

## 2022-03-06 DIAGNOSIS — R293 Abnormal posture: Secondary | ICD-10-CM | POA: Insufficient documentation

## 2022-03-06 DIAGNOSIS — N62 Hypertrophy of breast: Secondary | ICD-10-CM | POA: Insufficient documentation

## 2022-03-06 DIAGNOSIS — M546 Pain in thoracic spine: Secondary | ICD-10-CM | POA: Insufficient documentation

## 2022-03-06 DIAGNOSIS — M6281 Muscle weakness (generalized): Secondary | ICD-10-CM | POA: Insufficient documentation

## 2022-03-06 NOTE — Patient Instructions (Signed)
Access Code: [email or web address] ?URL: [email or web address] ?Date: 03/06/2022 ?Prepared by: Hilda Blades ? ?Exercises ?Supine Deep Neck Flexor Training - Repetitions - 1-2 x daily - 7 x weekly - 2 sets - 5 reps - 10 seconds hold ?Supine Shoulder Horizontal Abduction with Resistance - 1-2 x daily - 2 sets - 10 reps ?Standing Bilateral Low Shoulder Row with Anchored Resistance - 1-2 x daily - 2 sets - 10 reps ?Scapular Retraction with Resistance Advanced - 1-2 x daily - 2 sets - 10 reps ? ?

## 2022-03-13 ENCOUNTER — Other Ambulatory Visit: Payer: Self-pay | Admitting: Family

## 2022-03-13 NOTE — Therapy (Incomplete)
?OUTPATIENT PHYSICAL THERAPY TREATMENT NOTE ? ? ?Patient Name: Wanda Mays ?MRN: [id number] ?DOB:[birth year], 46 y.o., female ?Today's Date: 03/13/2022 ? ?PCP: [name], NP ?REFERRING PROVIDER: [name], NP ? ? ? ?Past Medical History:  ?Diagnosis Date  ? ADD (attention deficit disorder)   ? Anemia   ? Arthritis   ? Asthma   ? Bipolar 1 disorder ([place])   ? DDD (degenerative disc disease), lumbar   ? DDD (degenerative disc disease), thoracic   ? Dyspnea   ? Endometriosis   ? Fibroid   ? GERD (gastroesophageal reflux disease)   ? Hiatal hernia   ? Hypercholesteremia   ? Hypertension   ? Hyperthyroidism   ? Lung nodule seen on imaging study   ? OCD (obsessive compulsive disorder)   ? Pneumonia   ? ?Past Surgical History:  ?Procedure Laterality Date  ? ABDOMINAL HYSTERECTOMY    ? BREAST BIOPSY Left 11/23/2021  ? BREAST BIOPSY Left 12/28/2021  ? CESAREAN SECTION    ? CHOLECYSTECTOMY    ? COLONOSCOPY    ? around 2007 [place] did have polyps  ? LAPAROSCOPIC ABDOMINAL EXPLORATION    ? UPPER GASTROINTESTINAL ENDOSCOPY    ? ?Patient Active Problem List  ? Diagnosis Date Noted  ? Symptomatic mammary hypertrophy 02/10/2022  ? Back pain 02/10/2022  ? Neck pain 02/10/2022  ? Lupus anticoagulant positive 01/18/2022  ? Homocysteine level above reference range 01/18/2022  ? Preventative health care 01/13/2022  ? Lumbar radiculopathy 01/13/2022  ? Breast discharge 01/13/2022  ? Endometriosis determined by laparoscopy 11/16/2021  ? Positive ANA (antinuclear antibody) 11/16/2021  ? Intertrigo 10/11/2021  ? Axillary lymphadenopathy 10/11/2021  ? Arthralgia 10/11/2021  ? Myalgia 10/11/2021  ? Primary hypertension 10/11/2021  ? Hematochezia 09/15/2021  ? Hyperthyroidism 09/15/2021  ? Chest pain 09/15/2021  ? Lung nodule 09/14/2021  ? Bipolar 1 disorder ([place]) 08/25/2021  ? GERD (gastroesophageal reflux disease) 08/25/2021  ? Hypercholesteremia 08/25/2021  ? Degenerative disc disease, cervical 08/08/2021  ?  Degenerative disc disease, lumbar 10/23/2012  ? Bronchitis 04/21/2011  ? ? ?REFERRING PROVIDER: Wallace Going, DO ?  ?REFERRING DIAG: Symptomatic mammary hypertrophy, Chronic bilateral thoracic back pain, Neck pain ? ?THERAPY DIAG:  ?No diagnosis found. ? ?PERTINENT HISTORY: Diagnosed with Lupus recently ? ?PRECAUTIONS: None ? ?SUBJECTIVE: *** ? ?PAIN:  ?Are you having pain? Yes ?NPRS scale: 3/10 (7/10 at worst) ?Pain location: Neck, upper back ?Pain orientation: Bilateral ?PAIN TYPE: Chronic ?Pain description: Constant, dull ?Aggravating factors: Sitting up ?Relieving factors: Heat, ice ? ?PATIENT GOALS: Pain relief and improve posture ? ? ?OBJECTIVE:  ?PATIENT SURVEYS:  ?NDI 27/50 ?  ?POSTURE:  ?Rounded shoulders, forward head ?  ?PALPATION: ?Tender to palpation for bilateral upper trap region with increased muscle tension      ?  ?CERVICAL AROM ?  ?A/PROM AROM (deg) ?03/06/2022  ?Flexion 50  ?Extension 35  ?Right lateral flexion 40*  ?Left lateral flexion 45  ?Right rotation 70  ?Left rotation 60*  ?*Patient reports left upper trap tightness ?  ?UE MMT: ?  ?MMT Right ?03/06/2022 Left ?03/06/2022  ?Shoulder flexion 5 5  ?Shoulder extension 5 5  ?Shoulder abduction 5 5  ?Shoulder internal rotation 5 5  ?Shoulder external rotation 5 5  ?Periscapular musculature 4- 4-  ?Patient demonstrates upper trap compensation with all muscle testing ?  ? ?TODAY'S TREATMENT:  ?[facility] PT Treatment:  DATE: 03/17/2022 ?Therapeutic Exercise: ?Supine chin tuck with lift  5 x 10 sec ?Supine horizontal abduction with green x 10 ?Row with green x 10 ?Extension and scap retraction with green x 10 ?  ? ?[facility] Adult PT Treatment:                                                DATE: 03/06/2022 ?Therapeutic Exercise: ?Supine chin tuck with lift  5 x 10 sec ?Supine horizontal abduction with green x 10 ?Row with green x 10 ?Extension and scap retraction with green x 10 ?  ?PATIENT EDUCATION:   ?Education details: HEP, TPDN ?Person educated: Patient ?Education method: Explanation, Demonstration, Tactile cues, Verbal cues, and Handouts ?Education comprehension: verbalized understanding, returned demonstration, verbal cues required, tactile cues required, and needs further education ?  ?HOME EXERCISE PROGRAM: ?Access Code: [email or web address] ?  ?  ?ASSESSMENT: ?CLINICAL IMPRESSION: ?Patient tolerated therapy well with no adverse effects. *** Patient would benefit from continued skilled PT to progress her postural strength and endurance in order to reduce pain and maximize functional ability. ? ?Patient is a 46 y.o. female who was seen today for physical therapy evaluation and treatment for neck and upper back pain secondary to postural strength and endurance deficits.  ?  ?  ?OBJECTIVE IMPAIRMENTS decreased strength, postural dysfunction, and pain.  ?  ?ACTIVITY LIMITATIONS cleaning, community activity, driving, meal prep, occupation, laundry, yard work, and shopping.  ?  ?PERSONAL FACTORS Past/current experiences and Time since onset of injury/illness/exacerbation are also affecting patient's functional outcome.  ?  ?  ?GOALS: ?Goals reviewed with patient? Yes ?  ?SHORT TERM GOALS: ?  ?Patient will be I with initial HEP in order to progress with therapy. ?Baseline: HEP provided at eval ?Target date: 03/27/2022 ?Goal status: INITIAL ?  ?2.  Patient will report pain level </= 3/10 with activity or sitting up extended periods in order to reduce functional limitation. ?Baseline: 7/10 pain ?Target date: 03/27/2022 ?Goal status: INITIAL ?  ?LONG TERM GOALS: ?  ?Patient will be I with final HEP to maintain progress from PT. ?Baseline: HEP provided at eval ?Target date: 04/17/2022 ?Goal status: INITIAL ?  ?2.  Patient will report NDI </= 17/50 in order to indicate improvement in functional ability. ?Baseline: NDI 27/50 ?Target date: 04/17/2022 ?Goal status: INITIAL ?  ?3.  Patient will demonstrate periscapular strength >/= 4/5  MMT in order to improve her postural control and reduce pain with activity. ?Baseline: Grossly 4-/5 MMT ?Target date: 04/17/2022 ?Goal status: INITIAL ?  ?  ?PLAN: ?PT FREQUENCY: 1x/week ?  ?PT DURATION: 6 weeks ?  ?PLANNED INTERVENTIONS: Therapeutic exercises, Therapeutic activity, Neuromuscular re-education, Balance training, Gait training, Patient/Family education, Joint manipulation, Joint mobilization, Aquatic Therapy, Dry Needling, Electrical stimulation, Spinal manipulation, Spinal mobilization, Cryotherapy, Moist heat, Taping, and Manual therapy ?  ?PLAN FOR NEXT SESSION: Review HEP and progress PRN, manual/dry needling for upper trap and cervical region, progress postural strength and endurance ? ? ? ?Hilda Blades, PT, DPT, LAT, ATC ?03/13/22  1:47 PM ?Phone: [phone] ?Fax: [phone] ? ? ?  ? ?

## 2022-03-17 ENCOUNTER — Encounter: Payer: Self-pay | Admitting: Physical Therapy

## 2022-03-17 ENCOUNTER — Ambulatory Visit: Payer: 59 | Admitting: Physical Therapy

## 2022-03-20 ENCOUNTER — Other Ambulatory Visit: Payer: Self-pay | Admitting: Internal Medicine

## 2022-03-20 NOTE — Therapy (Incomplete)
?OUTPATIENT PHYSICAL THERAPY TREATMENT NOTE ? ? ?Patient Name: Wanda Mays ?MRN: [id number] ?DOB:04/19/1976, 46 y.o., female ?Today's Date: 03/20/2022 ? ?PCP: [name], NP ?REFERRING PROVIDER: Wallace Going, DO ? ? ? ?Past Medical History:  ?Diagnosis Date  ? ADD (attention deficit disorder)   ? Anemia   ? Arthritis   ? Asthma   ? Bipolar 1 disorder ([place])   ? DDD (degenerative disc disease), lumbar   ? DDD (degenerative disc disease), thoracic   ? Dyspnea   ? Endometriosis   ? Fibroid   ? GERD (gastroesophageal reflux disease)   ? Hiatal hernia   ? Hypercholesteremia   ? Hypertension   ? Hyperthyroidism   ? Lung nodule seen on imaging study   ? OCD (obsessive compulsive disorder)   ? Pneumonia   ? ?Past Surgical History:  ?Procedure Laterality Date  ? ABDOMINAL HYSTERECTOMY    ? BREAST BIOPSY Left 11/23/2021  ? BREAST BIOPSY Left 12/28/2021  ? CESAREAN SECTION    ? CHOLECYSTECTOMY    ? COLONOSCOPY    ? around 2007 [place] did have polyps  ? LAPAROSCOPIC ABDOMINAL EXPLORATION    ? UPPER GASTROINTESTINAL ENDOSCOPY    ? ?Patient Active Problem List  ? Diagnosis Date Noted  ? Symptomatic mammary hypertrophy 02/10/2022  ? Back pain 02/10/2022  ? Neck pain 02/10/2022  ? Lupus anticoagulant positive 01/18/2022  ? Homocysteine level above reference range 01/18/2022  ? Preventative health care 01/13/2022  ? Lumbar radiculopathy 01/13/2022  ? Breast discharge 01/13/2022  ? Endometriosis determined by laparoscopy 11/16/2021  ? Positive ANA (antinuclear antibody) 11/16/2021  ? Intertrigo 10/11/2021  ? Axillary lymphadenopathy 10/11/2021  ? Arthralgia 10/11/2021  ? Myalgia 10/11/2021  ? Primary hypertension 10/11/2021  ? Hematochezia 09/15/2021  ? Hyperthyroidism 09/15/2021  ? Chest pain 09/15/2021  ? Lung nodule 09/14/2021  ? Bipolar 1 disorder ([place]) 08/25/2021  ? GERD (gastroesophageal reflux disease) 08/25/2021  ? Hypercholesteremia 08/25/2021  ? Degenerative disc disease, cervical 08/08/2021  ?  Degenerative disc disease, lumbar 10/23/2012  ? Bronchitis 04/21/2011  ? ? ?REFERRING PROVIDER: Wallace Going, DO ?  ?REFERRING DIAG: Symptomatic mammary hypertrophy, Chronic bilateral thoracic back pain, Neck pain ? ?THERAPY DIAG:  ?No diagnosis found. ? ?PERTINENT HISTORY: Diagnosed with Lupus recently ? ?PRECAUTIONS: None ? ?SUBJECTIVE: *** ? ?PAIN:  ?Are you having pain? Yes ?NPRS scale: 3/10 (7/10 at worst) ?Pain location: Neck, upper back ?Pain orientation: Bilateral ?PAIN TYPE: Chronic ?Pain description: Constant, dull ?Aggravating factors: Sitting up ?Relieving factors: Heat, ice ? ?PATIENT GOALS: Pain relief and improve posture ? ? ?OBJECTIVE:  ?PATIENT SURVEYS:  ?NDI 27/50 ?  ?POSTURE:  ?Rounded shoulders, forward head ?  ?PALPATION: ?Tender to palpation for bilateral upper trap region with increased muscle tension      ?  ?CERVICAL AROM ?  ?A/PROM AROM (deg) ?03/06/2022  ?Flexion 50  ?Extension 35  ?Right lateral flexion 40*  ?Left lateral flexion 45  ?Right rotation 70  ?Left rotation 60*  ?*Patient reports left upper trap tightness ?  ?UE MMT: ?  ?MMT Right ?03/06/2022 Left ?03/06/2022  ?Shoulder flexion 5 5  ?Shoulder extension 5 5  ?Shoulder abduction 5 5  ?Shoulder internal rotation 5 5  ?Shoulder external rotation 5 5  ?Periscapular musculature 4- 4-  ?Patient demonstrates upper trap compensation with all muscle testing ?  ? ?TODAY'S TREATMENT:  ?[facility] PT Treatment:  DATE: 03/21/2022 ?Therapeutic Exercise: ?Supine chin tuck with lift  5 x 10 sec ?Supine horizontal abduction with green x 10 ?Row with green x 10 ?Extension and scap retraction with green x 10 ?  ? ?[facility] Adult PT Treatment:                                                DATE: 03/06/2022 ?Therapeutic Exercise: ?Supine chin tuck with lift  5 x 10 sec ?Supine horizontal abduction with green x 10 ?Row with green x 10 ?Extension and scap retraction with green x 10 ?  ?PATIENT EDUCATION:   ?Education details: HEP, TPDN ?Person educated: Patient ?Education method: Explanation, Demonstration, Tactile cues, Verbal cues, and Handouts ?Education comprehension: verbalized understanding, returned demonstration, verbal cues required, tactile cues required, and needs further education ?  ?HOME EXERCISE PROGRAM: ?Access Code: [email or web address] ?  ?  ?ASSESSMENT: ?CLINICAL IMPRESSION: ?Patient tolerated therapy well with no adverse effects. *** Patient would benefit from continued skilled PT to progress her postural strength and endurance in order to reduce pain and maximize functional ability. ? ?Patient is a 46 y.o. female who was seen today for physical therapy evaluation and treatment for neck and upper back pain secondary to postural strength and endurance deficits.  ?  ?  ?OBJECTIVE IMPAIRMENTS decreased strength, postural dysfunction, and pain.  ?  ?ACTIVITY LIMITATIONS cleaning, community activity, driving, meal prep, occupation, laundry, yard work, and shopping.  ?  ?PERSONAL FACTORS Past/current experiences and Time since onset of injury/illness/exacerbation are also affecting patient's functional outcome.  ?  ?  ?GOALS: ?Goals reviewed with patient? Yes ?  ?SHORT TERM GOALS: ?  ?Patient will be I with initial HEP in order to progress with therapy. ?Baseline: HEP provided at eval ?Target date: 03/27/2022 ?Goal status: INITIAL ?  ?2.  Patient will report pain level </= 3/10 with activity or sitting up extended periods in order to reduce functional limitation. ?Baseline: 7/10 pain ?Target date: 03/27/2022 ?Goal status: INITIAL ?  ?LONG TERM GOALS: ?  ?Patient will be I with final HEP to maintain progress from PT. ?Baseline: HEP provided at eval ?Target date: 04/17/2022 ?Goal status: INITIAL ?  ?2.  Patient will report NDI </= 17/50 in order to indicate improvement in functional ability. ?Baseline: NDI 27/50 ?Target date: 04/17/2022 ?Goal status: INITIAL ?  ?3.  Patient will demonstrate periscapular strength >/= 4/5  MMT in order to improve her postural control and reduce pain with activity. ?Baseline: Grossly 4-/5 MMT ?Target date: 04/17/2022 ?Goal status: INITIAL ?  ?  ?PLAN: ?PT FREQUENCY: 1x/week ?  ?PT DURATION: 6 weeks ?  ?PLANNED INTERVENTIONS: Therapeutic exercises, Therapeutic activity, Neuromuscular re-education, Balance training, Gait training, Patient/Family education, Joint manipulation, Joint mobilization, Aquatic Therapy, Dry Needling, Electrical stimulation, Spinal manipulation, Spinal mobilization, Cryotherapy, Moist heat, Taping, and Manual therapy ?  ?PLAN FOR NEXT SESSION: Review HEP and progress PRN, manual/dry needling for upper trap and cervical region, progress postural strength and endurance ? ? ? ?Hilda Blades, PT, DPT, LAT, ATC ?03/20/22  9:46 AM ?Phone: [phone] ?Fax: [phone] ? ? ?  ? ?

## 2022-03-20 NOTE — Telephone Encounter (Signed)
Next Visit: 04/05/2022 ? ?Last Visit: 01/18/2022 ? ?Labs: 10/03/2021 ? ?Eye exam: not on file  ? ?Current Dose per office note 01/18/2022: not discussed ? ?DE:[id number] ANA (antinuclear antibody) Lupus anticoagulant positive ? ?Last Fill: 01/23/2022 ? ?Okay to refill Plaquenil?  ?

## 2022-03-22 ENCOUNTER — Ambulatory Visit: Payer: 59 | Admitting: Physical Therapy

## 2022-03-23 ENCOUNTER — Other Ambulatory Visit: Payer: Self-pay | Admitting: Family

## 2022-03-24 ENCOUNTER — Other Ambulatory Visit: Payer: Self-pay | Admitting: Family

## 2022-03-27 ENCOUNTER — Ambulatory Visit: Payer: 59 | Admitting: Physical Therapy

## 2022-04-04 NOTE — Progress Notes (Signed)
? ?Office Visit Note ? ?Patient: Wanda Mays             ?Date of Birth: March 30, 1976           ?MRN: [id number]             ?PCP: Wanda Mays ?Referring: Wanda Mays ?Visit Date: 04/05/2022 ? ? ?Subjective:  ?Arthritis (PLQ is helping) ? ? ?History of Present Illness: Wanda Mays is a 46 y.o. female here for follow up for continued joint pain especially in back along with painful rashes any lymphadenopathy after starting HCQ 200 mg at last visit with persistent positive ANA and diffuse symptoms. After 6 weeks taking plaquenil she started noticing some improvement in pain and swelling in her arms and legs and some decrease in skin rashes. She remains fatigued daily. Back and bilateral hip pains remain about the same as before. Her GI symptoms are partially improved as well. She notices some episodic heat, redness, and sweating in areas around her face, chest, and ears that is new for her she questions if related to multiple medication changes. ?Updated chest CT showed stable lung nodule size and redemonstrated adenopathy. 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 She is working with PT and plans going for breast surgery. She also has upcoming hematology appt next week for f/u with positive lupus anticoagulant test. ? ?Previous HPI ?01/18/22 ?Wanda Mays is a 46 y.o. female here for follow up for multiple somewhat nonspecific symptoms including fatigue, joint and muscle pains, lymphadenopathy, and rashes with positive ANA. Labs checked in primary care office show low positive lupus anticoagulant test. She continues with pain in multiple areas especially low back with some radiation. Feet are numb some of the time only within the past few months. She still gets finger violaceous discoloration intermittently. Vomiting is decreased in frequency now about twice per week instead of all the time, and has some diarrhea intermittently. Bloody nipple discharge  without any underlying problem identified. She is already planned for upcoming back MRI. ?  ?Previous HPI ?11/16/21 ?Wanda Mays is a 45 y.o. female here for evaluation of multiple symptoms including joint and muscle pains, fatigue, nausea and vomiting, and lymphadenopathy and positive ANA.  She has had chronic pain to some extent ever since multiple levels back injury since around 2011.  This did not require any major interventions was not on long-term medication for symptoms.  Her physical activity has been very low since this increases back pain symptoms.  Since at least around 2014 has had GI issues with some hematochezia frequently having nausea vomiting and food intolerance.  Evaluation with gastroenterology has never identified any clear problem outside of GERD. ?All of her problems are dramatically increased more recently she had a 3 rounds of COVID infection most recently the summer and subsequently doing worse.  She has pain daily especially around the shoulders back and hips not particularly associated with level of activity position or anything else.  Some good days and some bad days more often bad.  GI symptoms also doing much worse having frequent vomiting and describes being unable to eat solid foods for up to 5 days at a time. She has developed vertigo symptoms improved after starting meclizine treatment.  No measured or reported orthostatic hypotension no syncope.  She has had some skin rashes with itchy goosebumps appearing changes she describes on her arms and face.  Episodic discoloration in hands with pallor in her fingers she reports after  they are hanging in a downward position for a long time. ?More recently also had CT chest imaging due to chest pains and elevated D-dimer that demonstrated 12 mm lung nodule.  Subsequent PET FDG scan was negative for metabolic enhancement of this nodule.  She also had axillary adenopathy on breast imaging has been scheduled for biopsy in a week. ?Several  medications have been tried for symptom treatment with this including gabapentin, methocarbamol, and cymbalta. She has major depression recently worsened as well now on wellbutrin. ? ? ?Review of Systems  ?Constitutional:  Positive for fatigue.  ?HENT:  Positive for mouth dryness.   ?Eyes:  Positive for dryness.  ?Respiratory:  Positive for shortness of breath.   ?Cardiovascular:  Negative for swelling in legs/feet.  ?Gastrointestinal:  Positive for constipation and diarrhea.  ?Endocrine: Positive for heat intolerance and excessive thirst.  ?Genitourinary:  Negative for difficulty urinating.  ?Musculoskeletal:  Positive for joint pain, gait problem, joint pain, joint swelling, muscle weakness, morning stiffness and muscle tenderness.  ?Skin:  Positive for rash.  ?Allergic/Immunologic: Positive for susceptible to infections.  ?Neurological:  Positive for weakness.  ?Hematological:  Negative for bruising/bleeding tendency.  ?Psychiatric/Behavioral:  Positive for sleep disturbance.   ? ?PMFS History:  ?Patient Active Problem List  ? Diagnosis Date Noted  ? 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 Symptomatic mammary hypertrophy 02/10/2022  ? Back pain 02/10/2022  ? Neck pain 02/10/2022  ? Lupus anticoagulant positive 01/18/2022  ? Homocysteine level above reference range 01/18/2022  ? Preventative health care 01/13/2022  ? Lumbar radiculopathy 01/13/2022  ? Breast discharge 01/13/2022  ? Endometriosis determined by laparoscopy 11/16/2021  ? Positive ANA (antinuclear antibody) 11/16/2021  ? Intertrigo 10/11/2021  ? Axillary lymphadenopathy 10/11/2021  ? Arthralgia 10/11/2021  ? Myalgia 10/11/2021  ? Primary hypertension 10/11/2021  ? Hematochezia 09/15/2021  ? Hyperthyroidism 09/15/2021  ? Chest pain 09/15/2021  ? Lung nodule 09/14/2021  ? Bipolar 1 disorder ([place]) 08/25/2021  ? GERD (gastroesophageal reflux disease) 08/25/2021  ? Hypercholesteremia 08/25/2021  ? Degenerative disc disease, cervical  08/08/2021  ? Degenerative disc disease, lumbar 10/23/2012  ? Bronchitis 04/21/2011  ?  ?Past Medical History:  ?Diagnosis Date  ? ADD (attention deficit disorder)   ? Anemia   ? Arthritis   ? Asthma   ? Bipolar 1 disorder ([employer])   ? DDD (degenerative disc disease), lumbar   ? DDD (degenerative disc disease), thoracic   ? Dyspnea   ? Endometriosis   ? Fibroid   ? GERD (gastroesophageal reflux disease)   ? Hiatal hernia   ? Hypercholesteremia   ? Hypertension   ? Hyperthyroidism   ? Lung nodule seen on imaging study   ? OCD (obsessive compulsive disorder)   ? Pneumonia   ?  ?Family History  ?Problem Relation Age of Onset  ? Clotting disorder Mother   ?     TIA's  ? Diabetes type II Mother   ? Hypertension Mother   ? Hypertension Father   ? Diabetes type II Father   ? Heart Problems Father   ? Hypertension Sister   ? Diabetes type II Sister   ? Heart murmur Brother   ? Asthma Brother   ? Asthma Daughter   ? ADD / ADHD Daughter   ? ADD / ADHD Son   ? Asthma Son   ? Cancer Paternal Uncle   ? Atrial fibrillation Maternal Grandmother   ? Cancer Maternal Grandfather   ? Cancer Paternal Grandfather   ?  Diabetes Mellitus I Paternal Grandfather   ? Heart Problems Paternal Grandfather   ? Lung cancer Paternal Grandfather   ? Colon cancer Neg Hx   ? Rectal cancer Neg Hx   ? Stomach cancer Neg Hx   ? ?Past Surgical History:  ?Procedure Laterality Date  ? ABDOMINAL HYSTERECTOMY    ? BREAST BIOPSY Left 11/23/2021  ? BREAST BIOPSY Left 12/28/2021  ? CESAREAN SECTION    ? CHOLECYSTECTOMY    ? COLONOSCOPY    ? around 2007 Tressie Ellis Flordia did have polyps  ? LAPAROSCOPIC ABDOMINAL EXPLORATION    ? UPPER GASTROINTESTINAL ENDOSCOPY    ? ?Social History  ? ?Social History Narrative  ? Patient is currently unemployed  ? CMA, has been doing customer service  ? Married  ? 5 children  ? [place]  ? 2000- Angelina  ? 2006 [place]  ? 2010Dellie Catholic (son)  ?   ? 2 children local and 3 in [place]  ?   ? Enjoys crafts, playing with  kids, plays piano  ? 1 cat and 1 dog  ? Completed associates degree  ? ?Immunization History  ?Administered Date(s) Administered  ? Td 12/25/2014  ?  ? ?Objective: ?Vital Signs: BP (!) 133/94 (BP Location: [place] Comment

## 2022-04-05 ENCOUNTER — Encounter: Payer: 59 | Admitting: Physical Therapy

## 2022-04-05 ENCOUNTER — Ambulatory Visit (INDEPENDENT_AMBULATORY_CARE_PROVIDER_SITE_OTHER): Payer: 59 | Admitting: Internal Medicine

## 2022-04-05 ENCOUNTER — Encounter: Payer: Self-pay | Admitting: Internal Medicine

## 2022-04-05 VITALS — BP 133/94 | HR 91 | Resp 16 | Ht 63.0 in | Wt 218.0 lb

## 2022-04-05 DIAGNOSIS — R590 Localized enlarged lymph nodes: Secondary | ICD-10-CM

## 2022-04-05 DIAGNOSIS — M359 Systemic involvement of connective tissue, unspecified: Secondary | ICD-10-CM | POA: Diagnosis not present

## 2022-04-05 DIAGNOSIS — R760 Raised antibody titer: Secondary | ICD-10-CM

## 2022-04-05 DIAGNOSIS — M5416 Radiculopathy, lumbar region: Secondary | ICD-10-CM

## 2022-04-05 DIAGNOSIS — R768 Other specified abnormal immunological findings in serum: Secondary | ICD-10-CM

## 2022-04-05 MED ORDER — HYDROXYCHLOROQUINE SULFATE 200 MG PO TABS: 400.0000 mg | ORAL_TABLET | Freq: Every day | ORAL | 2 refills | Status: AC

## 2022-04-06 ENCOUNTER — Other Ambulatory Visit: Payer: Self-pay | Admitting: Family

## 2022-04-07 ENCOUNTER — Other Ambulatory Visit (HOSPITAL_COMMUNITY): Payer: Self-pay

## 2022-04-07 ENCOUNTER — Other Ambulatory Visit: Payer: Self-pay | Admitting: Family

## 2022-04-07 MED ORDER — MECLIZINE HCL 25 MG PO TABS
25.0000 mg | ORAL_TABLET | Freq: Three times a day (TID) | ORAL | 0 refills | Status: AC | PRN
  Filled 2022-04-07 – 2022-05-09 (×2): qty 30, 10d supply, fill #0

## 2022-04-07 MED ORDER — PROPRANOLOL HCL 60 MG PO TABS
60.0000 mg | ORAL_TABLET | Freq: Two times a day (BID) | ORAL | 3 refills | Status: AC | PRN
  Filled 2022-04-07: qty 50, 25d supply, fill #0
  Filled 2022-04-07: qty 10, 5d supply, fill #0
  Filled 2022-05-09: qty 60, 30d supply, fill #0

## 2022-04-07 NOTE — Therapy (Incomplete)
?OUTPATIENT PHYSICAL THERAPY TREATMENT NOTE ? ? ?Patient Name: Wanda Mays ?MRN: [id number] ?DOB:Apr 20, 1976, 46 y.o., female ?Today's Date: 04/07/2022 ? ?PCP: [name], NP ?REFERRING PROVIDER: [name], NP ? ? ? ?Past Medical History:  ?Diagnosis Date  ? ADD (attention deficit disorder)   ? Anemia   ? Arthritis   ? Asthma   ? Bipolar 1 disorder ([place])   ? DDD (degenerative disc disease), lumbar   ? DDD (degenerative disc disease), thoracic   ? Dyspnea   ? Endometriosis   ? Fibroid   ? GERD (gastroesophageal reflux disease)   ? Hiatal hernia   ? Hypercholesteremia   ? Hypertension   ? Hyperthyroidism   ? Lung nodule seen on imaging study   ? OCD (obsessive compulsive disorder)   ? Pneumonia   ? ?Past Surgical History:  ?Procedure Laterality Date  ? ABDOMINAL HYSTERECTOMY    ? BREAST BIOPSY Left 11/23/2021  ? BREAST BIOPSY Left 12/28/2021  ? CESAREAN SECTION    ? CHOLECYSTECTOMY    ? COLONOSCOPY    ? around 2007 Tressie Ellis Flordia did have polyps  ? LAPAROSCOPIC ABDOMINAL EXPLORATION    ? UPPER GASTROINTESTINAL ENDOSCOPY    ? ?Patient Active Problem List  ? Diagnosis Date Noted  ? 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 Symptomatic mammary hypertrophy 02/10/2022  ? Back pain 02/10/2022  ? Neck pain 02/10/2022  ? Lupus anticoagulant positive 01/18/2022  ? Homocysteine level above reference range 01/18/2022  ? Preventative health care 01/13/2022  ? Lumbar radiculopathy 01/13/2022  ? Breast discharge 01/13/2022  ? Endometriosis determined by laparoscopy 11/16/2021  ? Positive ANA (antinuclear antibody) 11/16/2021  ? Intertrigo 10/11/2021  ? Axillary lymphadenopathy 10/11/2021  ? Arthralgia 10/11/2021  ? Myalgia 10/11/2021  ? Primary hypertension 10/11/2021  ? Hematochezia 09/15/2021  ? Hyperthyroidism 09/15/2021  ? Chest pain 09/15/2021  ? Lung nodule 09/14/2021  ? Bipolar 1 disorder ([place]) 08/25/2021  ? GERD (gastroesophageal reflux disease) 08/25/2021  ? Hypercholesteremia  08/25/2021  ? Degenerative disc disease, cervical 08/08/2021  ? Degenerative disc disease, lumbar 10/23/2012  ? Bronchitis 04/21/2011  ? ? ?REFERRING PROVIDER: Wallace Going, DO ?  ?REFERRING DIAG: Symptomatic mammary hypertrophy, Chronic bilateral thoracic back pain, Neck pain ? ?THERAPY DIAG:  ?No diagnosis found. ? ?PERTINENT HISTORY: Diagnosed with Lupus recently ? ?PRECAUTIONS: None ? ?SUBJECTIVE: *** ? ?PAIN:  ?Are you having pain? Yes ?NPRS scale: 3/10 (7/10 at worst) ?Pain location: Neck, upper back ?Pain orientation: Bilateral ?PAIN TYPE: Chronic ?Pain description: Constant, dull ?Aggravating factors: Sitting up ?Relieving factors: Heat, ice ? ?PATIENT GOALS: Pain relief and improve posture ? ? ?OBJECTIVE:  ?PATIENT SURVEYS:  ?NDI 27/50 ?  ?POSTURE:  ?Rounded shoulders, forward head ?  ?PALPATION: ?Tender to palpation for bilateral upper trap region with increased muscle tension      ?  ?CERVICAL AROM ?  ?A/PROM AROM (deg) ?03/06/2022  ?Flexion 50  ?Extension 35  ?Right lateral flexion 40*  ?Left lateral flexion 45  ?Right rotation 70  ?Left rotation 60*  ?*Patient reports left upper trap tightness ?  ?UE MMT: ?  ?MMT Right ?03/06/2022 Left ?03/06/2022  ?Shoulder flexion 5 5  ?Shoulder extension 5 5  ?Shoulder abduction 5 5  ?Shoulder internal rotation 5 5  ?Shoulder external rotation 5 5  ?Periscapular musculature 4- 4-  ?Patient demonstrates upper trap compensation with all muscle testing ?  ? ?TODAY'S TREATMENT:  ?[facility] Adult PT Treatment:  DATE: 04/10/2022 ?Therapeutic Exercise: ?Supine chin tuck with lift  5 x 10 sec ?Supine horizontal abduction with green x 10 ?Row with green x 10 ?Extension and scap retraction with green x 10 ?  ? ?[facility] Adult PT Treatment:                                                DATE: 03/06/2022 ?Therapeutic Exercise: ?Supine chin tuck with lift  5 x 10 sec ?Supine horizontal abduction with green x 10 ?Row with green x  10 ?Extension and scap retraction with green x 10 ?  ?PATIENT EDUCATION:  ?Education details: HEP, TPDN ?Person educated: Patient ?Education method: Explanation, Demonstration, Tactile cues, Verbal cues, and Handouts ?Education comprehension: verbalized understanding, returned demonstration, verbal cues required, tactile cues required, and needs further education ?  ?HOME EXERCISE PROGRAM: ?Access Code: [email or web address] ?  ?  ?ASSESSMENT: ?CLINICAL IMPRESSION: ?Patient tolerated therapy well with no adverse effects. *** Patient would benefit from continued skilled PT to progress her postural strength and endurance in order to reduce pain and maximize functional ability. ? ?Patient is a 46 y.o. female who was seen today for physical therapy evaluation and treatment for neck and upper back pain secondary to postural strength and endurance deficits.  ?  ?  ?OBJECTIVE IMPAIRMENTS decreased strength, postural dysfunction, and pain.  ?  ?ACTIVITY LIMITATIONS cleaning, community activity, driving, meal prep, occupation, laundry, yard work, and shopping.  ?  ?PERSONAL FACTORS Past/current experiences and Time since onset of injury/illness/exacerbation are also affecting patient's functional outcome.  ?  ?  ?GOALS: ?Goals reviewed with patient? Yes ?  ?SHORT TERM GOALS: ?  ?Patient will be I with initial HEP in order to progress with therapy. ?Baseline: HEP provided at eval ?Target date: 03/27/2022 ?Goal status: INITIAL ?  ?2.  Patient will report pain level </= 3/10 with activity or sitting up extended periods in order to reduce functional limitation. ?Baseline: 7/10 pain ?Target date: 03/27/2022 ?Goal status: INITIAL ?  ?LONG TERM GOALS: ?  ?Patient will be I with final HEP to maintain progress from PT. ?Baseline: HEP provided at eval ?Target date: 04/17/2022 ?Goal status: INITIAL ?  ?2.  Patient will report NDI </= 17/50 in order to indicate improvement in functional ability. ?Baseline: NDI 27/50 ?Target date: 04/17/2022 ?Goal status:  INITIAL ?  ?3.  Patient will demonstrate periscapular strength >/= 4/5 MMT in order to improve her postural control and reduce pain with activity. ?Baseline: Grossly 4-/5 MMT ?Target date: 04/17/2022 ?Goal status: INITIAL ?  ?  ?PLAN: ?PT FREQUENCY: 1x/week ?  ?PT DURATION: 6 weeks ?  ?PLANNED INTERVENTIONS: Therapeutic exercises, Therapeutic activity, Neuromuscular re-education, Balance training, Gait training, Patient/Family education, Joint manipulation, Joint mobilization, Aquatic Therapy, Dry Needling, Electrical stimulation, Spinal manipulation, Spinal mobilization, Cryotherapy, Moist heat, Taping, and Manual therapy ?  ?PLAN FOR NEXT SESSION: Review HEP and progress PRN, manual/dry needling for upper trap and cervical region, progress postural strength and endurance ? ? ? ?Hilda Blades, PT, DPT, LAT, ATC ?04/07/22  8:16 AM ?Phone: [phone] ?Fax: [phone] ? ? ?  ? ?

## 2022-04-10 ENCOUNTER — Ambulatory Visit: Payer: 59 | Admitting: Physical Therapy

## 2022-04-10 ENCOUNTER — Other Ambulatory Visit (HOSPITAL_COMMUNITY): Payer: Self-pay

## 2022-04-13 ENCOUNTER — Other Ambulatory Visit: Payer: Self-pay

## 2022-04-13 ENCOUNTER — Emergency Department (HOSPITAL_BASED_OUTPATIENT_CLINIC_OR_DEPARTMENT_OTHER)
Admission: EM | Admit: 2022-04-13 | Discharge: 2022-04-13 | Disposition: A | Discharge status: Home / Self Care | Payer: 59 | Attending: Emergency Medicine | Admitting: Emergency Medicine

## 2022-04-13 ENCOUNTER — Other Ambulatory Visit (HOSPITAL_BASED_OUTPATIENT_CLINIC_OR_DEPARTMENT_OTHER): Payer: Self-pay

## 2022-04-13 ENCOUNTER — Encounter (HOSPITAL_BASED_OUTPATIENT_CLINIC_OR_DEPARTMENT_OTHER): Payer: Self-pay | Admitting: Emergency Medicine

## 2022-04-13 DIAGNOSIS — R440 Auditory hallucinations: Secondary | ICD-10-CM | POA: Insufficient documentation

## 2022-04-13 DIAGNOSIS — Z8659 Personal history of other mental and behavioral disorders: Secondary | ICD-10-CM | POA: Diagnosis not present

## 2022-04-13 DIAGNOSIS — Y9289 Other specified places as the place of occurrence of the external cause: Secondary | ICD-10-CM | POA: Insufficient documentation

## 2022-04-13 DIAGNOSIS — R441 Visual hallucinations: Secondary | ICD-10-CM | POA: Diagnosis not present

## 2022-04-13 DIAGNOSIS — I10 Essential (primary) hypertension: Secondary | ICD-10-CM | POA: Insufficient documentation

## 2022-04-13 DIAGNOSIS — S060X0A Concussion without loss of consciousness, initial encounter: Secondary | ICD-10-CM | POA: Insufficient documentation

## 2022-04-13 DIAGNOSIS — Z79899 Other long term (current) drug therapy: Secondary | ICD-10-CM | POA: Insufficient documentation

## 2022-04-13 DIAGNOSIS — S0990XA Unspecified injury of head, initial encounter: Secondary | ICD-10-CM | POA: Diagnosis present

## 2022-04-13 DIAGNOSIS — W01198A Fall on same level from slipping, tripping and stumbling with subsequent striking against other object, initial encounter: Secondary | ICD-10-CM | POA: Insufficient documentation

## 2022-04-13 DIAGNOSIS — R443 Hallucinations, unspecified: Secondary | ICD-10-CM

## 2022-04-13 LAB — URINALYSIS, ROUTINE W REFLEX MICROSCOPIC
Bilirubin Urine: NEGATIVE
Glucose, UA: NEGATIVE mg/dL
Hgb urine dipstick: NEGATIVE
Ketones, ur: NEGATIVE mg/dL
Leukocytes,Ua: NEGATIVE
Nitrite: NEGATIVE
Protein, ur: NEGATIVE mg/dL
Specific Gravity, Urine: 1.025 (ref 1.005–1.030)
pH: 7 (ref 5.0–8.0)

## 2022-04-13 LAB — RAPID URINE DRUG SCREEN, HOSP PERFORMED
Amphetamines: NOT DETECTED
Barbiturates: NOT DETECTED
Benzodiazepines: NOT DETECTED
Cocaine: NOT DETECTED
Opiates: NOT DETECTED
Tetrahydrocannabinol: POSITIVE — AB

## 2022-04-13 LAB — PREGNANCY, URINE: Preg Test, Ur: NEGATIVE

## 2022-04-13 MED ORDER — ZOLMITRIPTAN 2.5 MG PO TBDP: 2.5000 mg | ORAL_TABLET | ORAL | 0 refills | Status: AC | PRN

## 2022-04-13 NOTE — ED Notes (Signed)
This RN interviewed patient. Patient arrived today at the advise of her psychiatrist for auditory and visual hallucinations x Sunday. Per husband they have been small things like - wrapping up a blanket and telling him that she is holding her niece.  ?Patient denies any HI/SI thoughts ?Per husband she has not displayed any dangerous/paranoid or unruly behavior.  ?Upon assessment, Patient is A&OX4/calm/cooperative and appropriate in responses. Patient states hallucinations come and go and Patient is easily redirectable. No physical complaints voiced or reported.  ?Awaiting MSE at this time, patients so at bedside.  ? ?

## 2022-04-13 NOTE — ED Provider Notes (Signed)
?[facility] EMERGENCY DEPARTMENT ?Provider Note ? ? ?CSN: [id number] ?Arrival date & time: 04/13/22  1622 ? ?  ? ?History ? ?Chief Complaint  ?Patient presents with  ? Hallucinations  ? ? ?Wanda Mays is a 46 y.o. female who presents the emergency department complaining of headache and hallucinations.  Patient is a history of bipolar 1 disorder, and during manic episodes often has visual and auditory hallucinations.  She states that about 5 days ago she fell in her laundry room, and struck her head on the concrete floor.  She did not lose consciousness.  She states that since then she has been having migraine headaches daily, and is ran out of her prescription migraine medicine.  She was speaking to her psychiatrist earlier today, and they wanted her to "get her head checked out".  She states the hallucinations she is having now clued seeing people as well as shapes, and hearing voices that she is having conversations with.  She denies suicidal or homicidal ideation.  Denies paranoia or other delusions. ? ?HPI ? ?  ? ?Home Medications ?Prior to Admission medications   ?Medication Sig Start Date End Date Taking? Authorizing Provider  ?zolmitriptan (ZOMIG-ZMT) 2.5 MG disintegrating tablet Take 1 tablet (2.5 mg total) by mouth as needed for migraine. 04/13/22  Yes Eliani Leclere T, PA-C  ?ADVAIR DISKUS 250-50 MCG/ACT AEPB Inhale 1 puff into the lungs 2 (two) times daily. 10/11/21   [name], NP  ?albuterol (VENTOLIN HFA) 108 (90 Base) MCG/ACT inhaler Inhale 2 puffs into the lungs every 6 (six) hours as needed for wheezing or shortness of breath. 08/05/21   Chevis Pretty, FNP  ?buPROPion [facility] SR) 150 MG 12 hr tablet TAKE 1 TABLET BY MOUTH TWICE A DAY 11/18/21   [name], NP  ?cyclobenzaprine (FEXMID) 7.5 MG tablet Take 7.5 mg by mouth daily. 03/13/22   [provider]  ?doxycycline (VIBRA-TABS) 100 MG tablet Take 1 tablet (100 mg total) by mouth 2 (two) times  daily. 02/07/22   [name], NP  ?DULoxetine (CYMBALTA) 30 MG capsule Take by mouth. 10/19/21   [provider]  ?DULoxetine (CYMBALTA) 60 MG capsule Take 90 mg by mouth daily. 08/20/21   [provider]  ?fluconazole (DIFLUCAN) 150 MG tablet Take 1 tablet by mouth once weekly for 3 weeks ?Patient not taking: Reported on 04/05/2022 02/07/22   [name], NP  ?gabapentin (NEURONTIN) 100 MG capsule TAKE 1 CAPSULE (100 MG TOTAL) BY MOUTH THREE TIMES DAILY. 02/20/22   [name], NP  ?hydrochlorothiazide (HYDRODIURIL) 25 MG tablet TAKE 1 TABLET (25 MG TOTAL) BY MOUTH DAILY. 01/02/22   [name], NP  ?hydroxychloroquine (PLAQUENIL) 200 MG tablet Take 2 tablets (400 mg total) by mouth daily. 04/05/22   Rice, Resa Miner, MD  ?hydrOXYzine (ATARAX) 25 MG tablet TAKE 1 TABLET BY MOUTH THREE TIMES A DAY AS NEEDED FOR ANXIETY 03/13/22   [name], NP  ?hyoscyamine (LEVSIN SL) 0.125 MG SL tablet PLACE 1 TABLET UNDER THE TONGUE EVERY 6 HOURS AS NEEDED FOR CRAMPING. 01/31/22   Cirigliano, Vito V, DO  ?ipratropium-albuterol (DUONEB) 0.5-2.5 (3) MG/3ML SOLN Take 3 mLs by nebulization every 4 (four) hours as needed. 09/17/21   Chevis Pretty, FNP  ?LATUDA 20 MG TABS tablet SMARTSIG:1 Tablet(s) By Mouth Every Evening 10/31/21   [provider]  ?LORazepam (ATIVAN) 1 MG tablet One tablet by mouth once, 30 minutes prior to MRI- do not drive after taking. ?Patient not taking: Reported on 04/05/2022 01/23/22  [name], NP  ?lurasidone (LATUDA) 80 MG TABS tablet Take 80 mg by mouth daily. 03/07/22   [provider]  ?Lurasidone HCl 60 MG TABS SMARTSIG:1 Tablet(s) By Mouth Every Evening 02/21/22   [provider]  ?meclizine (ANTIVERT) 25 MG tablet Take 1 tablet (25 mg total) by mouth 3 (three) times daily as needed for dizziness. 04/07/22   [name], NP  ?methocarbamol (ROBAXIN) 500 MG tablet TAKE 1 TABLET BY MOUTH EVERY 8 HOURS AS  NEEDED FOR MUSCLE SPASMS 03/23/22   [name], NP  ?nystatin (MYCOSTATIN/NYSTOP) powder Apply 1 application topically 3 (three) times daily. 02/07/22   [name], NP  ?omeprazole (PRILOSEC) 40 MG capsule TAKE 1 CAPSULE (40 MG TOTAL) BY MOUTH 2 (TWO) TIMES DAILY. PRILOSEC 40 MG TWICE DAILY FOR 6 WEEKS, THEN REDUCE TO 40 MG DAILY AND TITRATE TO LOWEST EFFECTIVE DOSE TO CONTROL REFLUX. 01/31/22   Cirigliano, Vito V, DO  ?ondansetron (ZOFRAN) 4 MG tablet Take 1 tablet (4 mg total) by mouth every 8 (eight) hours as needed for nausea or vomiting. 10/11/21   [name], NP  ?ondansetron (ZOFRAN) 4 MG tablet Take by mouth. ?Patient not taking: Reported on 04/05/2022    [provider]  ?ondansetron (ZOFRAN) 4 MG tablet TAKE 1 TABLET BY MOUTH EVERY 8 HOURS AS NEEDED FOR NAUSEA AND VOMITING ?Patient not taking: Reported on 04/05/2022 01/31/22   [name], NP  ?potassium chloride (KLOR-CON) 10 MEQ tablet Take 1 tablet (10 mEq total) by mouth daily. 10/11/21   [name], NP  ?propranolol (INDERAL) 40 MG tablet TAKE 1 TABLET (40 MG TOTAL) BY MOUTH EVERY 8 (EIGHT) HOURS. 12/21/21   Tobb, Kardie, DO  ?propranolol (INDERAL) 60 MG tablet Take 1 tablet (60 mg total) by mouth 2 (two) times daily as needed. 04/07/22   [name], NP  ?traZODone (DESYREL) 100 MG tablet TAKE 1-2 TABLETS (100-200 MG TOTAL) BY MOUTH AT BEDTIME AS NEEDED. 03/25/22   [name], NP  ?valproic acid (DEPAKENE) 250 MG capsule Take 250 mg by mouth daily. 03/22/22   [provider]  ?   ? ?Allergies    ?Penicillins and Aspirin   ? ?Review of Systems   ?Review of Systems  ?Gastrointestinal:  Negative for vomiting.  ?Neurological:  Positive for headaches. Negative for dizziness, syncope, speech difficulty, weakness, light-headedness and numbness.  ?Psychiatric/Behavioral:  Positive for hallucinations.   ?All other systems reviewed and are negative. ? ?Physical Exam ?Updated Vital Signs ?BP  120/70   Pulse 98   Temp 98.2 ?F (36.8 ?C) (Oral)   Resp 16   Ht '5\' 3"'$  (1.6 m)   Wt 98 kg   SpO2 100%   BMI 38.26 kg/m?  ?Physical Exam ?Vitals and nursing note reviewed.  ?Constitutional:   ?   Appearance: Normal appearance.  ?HENT:  ?   Head: Normocephalic and atraumatic.  ?   Comments: Tenderness palpation of the left temple, no deformity. ?Eyes:  ?   Conjunctiva/sclera: Conjunctivae normal.  ?Cardiovascular:  ?   Rate and Rhythm: Normal rate and regular rhythm.  ?Pulmonary:  ?   Effort: Pulmonary effort is normal. No respiratory distress.  ?   Breath sounds: Normal breath sounds.  ?Abdominal:  ?   General: There is no distension.  ?   Palpations: Abdomen is soft.  ?   Tenderness: There is no abdominal tenderness.  ?Skin: ?   General: Skin is warm and dry.  ?Neurological:  ?   General: No focal deficit  present.  ?   Mental Status: She is alert.  ?   Comments: Neuro: Speech is clear, able to follow commands. CN III-XII intact grossly intact. PERRLA. EOMI. Sensation intact throughout. Str 5/5 all extremities.  ?Psychiatric:     ?   Mood and Affect: Mood and affect normal.     ?   Speech: Speech normal.     ?   Behavior: Behavior normal. Behavior is cooperative.     ?   Thought Content: Thought content normal.  ?   Comments: No active hallucinations.   ? ? ?ED Results / Procedures / Treatments   ?Labs ?(all labs ordered are listed, but only abnormal results are displayed) ?Labs Reviewed  ?RAPID URINE DRUG SCREEN, HOSP PERFORMED - Abnormal; Notable for the following components:  ?    Result Value  ? Tetrahydrocannabinol POSITIVE (*)   ? All other components within normal limits  ?PREGNANCY, URINE  ?URINALYSIS, ROUTINE W REFLEX MICROSCOPIC  ? ? ?EKG ?None ? ?Radiology ?No results found. ? ?Procedures ?Procedures  ? ? ?Medications Ordered in ED ?Medications - No data to display ? ?ED Course/ Medical Decision Making/ A&P ?  ?                        ?Medical Decision Making ?Amount and/or Complexity of Data  Reviewed ?Labs: ordered. ? ?Risk ?Prescription drug management. ? ? ?This patient is a 46 year old female who presents to the ED for concern of headache and hallucinations.  ? ?Differential diagnoses prior to evaluation:

## 2022-04-13 NOTE — ED Triage Notes (Signed)
Pt arrives pov, reports sent by psychiatrists, endorse hallucinations and hearing voices x 6 days pta. Reports taking meds as prescribed. Pt denies SI/HI. AOx4 ?

## 2022-04-13 NOTE — Discharge Instructions (Addendum)
You are seen the emergency department today for hallucinations and headache. ? ?As we discussed I think your symptoms are likely related to a concussion. I am prescribing you a different migraine medicine that is similar to the one you've taken previously. I am attaching some information about the medication as well as concussion treatment. ? ?Continue to monitor how you're doing and return to the ER for new or worsening symptoms.  ?

## 2022-04-14 ENCOUNTER — Ambulatory Visit: Payer: 59 | Admitting: Family

## 2022-04-17 ENCOUNTER — Other Ambulatory Visit (HOSPITAL_COMMUNITY): Payer: Self-pay

## 2022-04-18 ENCOUNTER — Telehealth (INDEPENDENT_AMBULATORY_CARE_PROVIDER_SITE_OTHER): Payer: 59 | Admitting: Family

## 2022-04-18 DIAGNOSIS — F319 Bipolar disorder, unspecified: Secondary | ICD-10-CM

## 2022-04-18 DIAGNOSIS — S060X0A Concussion without loss of consciousness, initial encounter: Secondary | ICD-10-CM | POA: Diagnosis not present

## 2022-04-18 NOTE — Assessment & Plan Note (Addendum)
Uncontrolled. Currently having visual/auditory hallucinations. States her mom took her children to [place] to care for them until she is feeling better. She has follow up scheduled next week with her psychiatrist. She is advised to keep this follow up and to go to the ER if severe/worsening symptoms or if she develops thoughts of hurting herself or others.  ? ?Addendum: I was able to connect with her psychiatrist. Roma Schanz. She is aware of the patient's hallucinations and the patient has follow up scheduled. She states that she is considering diagnosis of personality disorder in addition to her bipolar disorder. States that her reported hx especially in regards to her loss of memory has been inconsistent. States visual hallucinations began the day after her disability was denied.  ?

## 2022-04-18 NOTE — Therapy (Incomplete)
?OUTPATIENT PHYSICAL THERAPY TREATMENT NOTE ? ? ?Patient Name: Wanda Mays ?MRN: [id number] ?DOB:08-24-1976, 46 y.o., female ?Today's Date: 04/18/2022 ? ?PCP: [name], NP ?REFERRING PROVIDER: [name], NP ? ? ? ?Past Medical History:  ?Diagnosis Date  ? ADD (attention deficit disorder)   ? Anemia   ? Arthritis   ? Asthma   ? Bipolar 1 disorder ([place])   ? DDD (degenerative disc disease), lumbar   ? DDD (degenerative disc disease), thoracic   ? Dyspnea   ? Endometriosis   ? Fibroid   ? GERD (gastroesophageal reflux disease)   ? Hiatal hernia   ? Hypercholesteremia   ? Hypertension   ? Hyperthyroidism   ? Lung nodule seen on imaging study   ? OCD (obsessive compulsive disorder)   ? Pneumonia   ? ?Past Surgical History:  ?Procedure Laterality Date  ? ABDOMINAL HYSTERECTOMY    ? BREAST BIOPSY Left 11/23/2021  ? BREAST BIOPSY Left 12/28/2021  ? CESAREAN SECTION    ? CHOLECYSTECTOMY    ? COLONOSCOPY    ? around 2007 Tressie Ellis Flordia did have polyps  ? LAPAROSCOPIC ABDOMINAL EXPLORATION    ? UPPER GASTROINTESTINAL ENDOSCOPY    ? ?Patient Active Problem List  ? Diagnosis Date Noted  ? 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 Symptomatic mammary hypertrophy 02/10/2022  ? Back pain 02/10/2022  ? Neck pain 02/10/2022  ? Lupus anticoagulant positive 01/18/2022  ? Homocysteine level above reference range 01/18/2022  ? Preventative health care 01/13/2022  ? Lumbar radiculopathy 01/13/2022  ? Breast discharge 01/13/2022  ? Endometriosis determined by laparoscopy 11/16/2021  ? Positive ANA (antinuclear antibody) 11/16/2021  ? Intertrigo 10/11/2021  ? Axillary lymphadenopathy 10/11/2021  ? Arthralgia 10/11/2021  ? Myalgia 10/11/2021  ? Primary hypertension 10/11/2021  ? Hematochezia 09/15/2021  ? Hyperthyroidism 09/15/2021  ? Chest pain 09/15/2021  ? Lung nodule 09/14/2021  ? Bipolar 1 disorder ([place]) 08/25/2021  ? GERD (gastroesophageal reflux disease) 08/25/2021  ? Hypercholesteremia  08/25/2021  ? Degenerative disc disease, cervical 08/08/2021  ? Degenerative disc disease, lumbar 10/23/2012  ? Bronchitis 04/21/2011  ? ? ?REFERRING PROVIDER: Wallace Going, DO ?  ?REFERRING DIAG: Symptomatic mammary hypertrophy, Chronic bilateral thoracic back pain, Neck pain ? ?THERAPY DIAG:  ?No diagnosis found. ? ?PERTINENT HISTORY: Diagnosed with Lupus recently ? ?PRECAUTIONS: None ? ?SUBJECTIVE: *** ? ?PAIN:  ?Are you having pain? Yes ?NPRS scale: 3/10 (7/10 at worst) ?Pain location: Neck, upper back ?Pain orientation: Bilateral ?PAIN TYPE: Chronic ?Pain description: Constant, dull ?Aggravating factors: Sitting up ?Relieving factors: Heat, ice ? ?PATIENT GOALS: Pain relief and improve posture ? ? ?OBJECTIVE:  ?PATIENT SURVEYS:  ?NDI 27/50 ?  ?POSTURE:  ?Rounded shoulders, forward head ?  ?PALPATION: ?Tender to palpation for bilateral upper trap region with increased muscle tension      ?  ?CERVICAL AROM ?  ?A/PROM AROM (deg) ?03/06/2022  ?Flexion 50  ?Extension 35  ?Right lateral flexion 40*  ?Left lateral flexion 45  ?Right rotation 70  ?Left rotation 60*  ?*Patient reports left upper trap tightness ?  ?UE MMT: ?  ?MMT Right ?03/06/2022 Left ?03/06/2022  ?Shoulder flexion 5 5  ?Shoulder extension 5 5  ?Shoulder abduction 5 5  ?Shoulder internal rotation 5 5  ?Shoulder external rotation 5 5  ?Periscapular musculature 4- 4-  ?Patient demonstrates upper trap compensation with all muscle testing ?  ? ?TODAY'S TREATMENT:  ?[facility] Adult PT Treatment:  DATE: 04/19/2022 ?Therapeutic Exercise: ?Supine chin tuck with lift  5 x 10 sec ?Supine horizontal abduction with green x 10 ?Row with green x 10 ?Extension and scap retraction with green x 10 ?  ? ?[facility] Adult PT Treatment:                                                DATE: 03/06/2022 ?Therapeutic Exercise: ?Supine chin tuck with lift  5 x 10 sec ?Supine horizontal abduction with green x 10 ?Row with green x  10 ?Extension and scap retraction with green x 10 ?  ?PATIENT EDUCATION:  ?Education details: HEP, TPDN ?Person educated: Patient ?Education method: Explanation, Demonstration, Tactile cues, Verbal cues, and Handouts ?Education comprehension: verbalized understanding, returned demonstration, verbal cues required, tactile cues required, and needs further education ?  ?HOME EXERCISE PROGRAM: ?Access Code: [email or web address] ?  ?  ?ASSESSMENT: ?CLINICAL IMPRESSION: ?Patient tolerated therapy well with no adverse effects. *** Patient would benefit from continued skilled PT to progress her postural strength and endurance in order to reduce pain and maximize functional ability. ? ?Patient is a 46 y.o. female who was seen today for physical therapy evaluation and treatment for neck and upper back pain secondary to postural strength and endurance deficits.  ?  ?  ?OBJECTIVE IMPAIRMENTS decreased strength, postural dysfunction, and pain.  ?  ?ACTIVITY LIMITATIONS cleaning, community activity, driving, meal prep, occupation, laundry, yard work, and shopping.  ?  ?PERSONAL FACTORS Past/current experiences and Time since onset of injury/illness/exacerbation are also affecting patient's functional outcome.  ?  ?  ?GOALS: ?Goals reviewed with patient? Yes ?  ?SHORT TERM GOALS: ?  ?Patient will be I with initial HEP in order to progress with therapy. ?Baseline: HEP provided at eval ?Target date: 03/27/2022 ?Goal status: INITIAL ?  ?2.  Patient will report pain level </= 3/10 with activity or sitting up extended periods in order to reduce functional limitation. ?Baseline: 7/10 pain ?Target date: 03/27/2022 ?Goal status: INITIAL ?  ?LONG TERM GOALS: ?  ?Patient will be I with final HEP to maintain progress from PT. ?Baseline: HEP provided at eval ?Target date: 04/17/2022 ?Goal status: INITIAL ?  ?2.  Patient will report NDI </= 17/50 in order to indicate improvement in functional ability. ?Baseline: NDI 27/50 ?Target date: 04/17/2022 ?Goal status:  INITIAL ?  ?3.  Patient will demonstrate periscapular strength >/= 4/5 MMT in order to improve her postural control and reduce pain with activity. ?Baseline: Grossly 4-/5 MMT ?Target date: 04/17/2022 ?Goal status: INITIAL ?  ?  ?PLAN: ?PT FREQUENCY: 1x/week ?  ?PT DURATION: 6 weeks ?  ?PLANNED INTERVENTIONS: Therapeutic exercises, Therapeutic activity, Neuromuscular re-education, Balance training, Gait training, Patient/Family education, Joint manipulation, Joint mobilization, Aquatic Therapy, Dry Needling, Electrical stimulation, Spinal manipulation, Spinal mobilization, Cryotherapy, Moist heat, Taping, and Manual therapy ?  ?PLAN FOR NEXT SESSION: Review HEP and progress PRN, manual/dry needling for upper trap and cervical region, progress postural strength and endurance ? ? ? ?Hilda Blades, PT, DPT, LAT, ATC ?04/18/22  8:07 AM ?Phone: [phone] ?Fax: [phone] ? ? ?  ? ?

## 2022-04-18 NOTE — Assessment & Plan Note (Signed)
New. I think that she is doing fine from a concussion standpoint, however she continues to have psych issues.  ?

## 2022-04-18 NOTE — Progress Notes (Signed)
? ? ?MyChart Video Visit ? ? ? ?Virtual Visit via Video Note  ? ?This visit type was conducted due to national recommendations for restrictions regarding the COVID-19 Pandemic (e.g. social distancing) in an effort to limit this patient's exposure and mitigate transmission in our community. This patient is at least at moderate risk for complications without adequate follow up. This format is felt to be most appropriate for this patient at this time. Physical exam was limited by quality of the video and audio technology used for the visit. CMA was able to get the patient set up on a video visit. ? ?Patient location: Home Patient and provider in visit ?Provider location: Office ? ?I discussed the limitations of evaluation and management by telemedicine and the availability of in person appointments. The patient expressed understanding and agreed to proceed. ? ?Visit Date: 04/18/2022 ? ?Today's healthcare provider: Nance Pear, NP  ? ? ? ?Subjective:  ? ? Patient ID: Wanda Mays, female    DOB: Sep 23, 1976, 46 y.o.   MRN: [id number] ? ?Chief Complaint  ?Patient presents with  ? Follow-up  ?  Hospital follow up  ? ? ?HPI ?Patient is in today for a virtual office visit.  ? ?Fall/Leg Weakness - She had a fall on 04/11/2022 because of her leg weakness. She was later admitted to the ED on 04/13/2022 for a concussion. She was discharged on the same day. As of today's visit, she is currently experiencing leg weakness. She states that the leg weakness is more on the right leg than the left. She reports that her leg weakness is no worse than it was at the time of her spine MRI.  ? ?Auditory and Visual Hallucinations - She is still experiencing hallucinations. She would do things and not be aware that she's doing them. She would also hear things such as a door shaking and items hitting the window as if someone is breaking in. Her temper has been elevating. She is still seeing a psychiatrist. She has not missed any of  her medications. She denies of any headaches at the time. She also denies of wanting to hurt herself or others.  ? ? ?Past Medical History:  ?Diagnosis Date  ? ADD (attention deficit disorder)   ? Anemia   ? Arthritis   ? Asthma   ? Bipolar 1 disorder ([place])   ? DDD (degenerative disc disease), lumbar   ? DDD (degenerative disc disease), thoracic   ? Dyspnea   ? Endometriosis   ? Fibroid   ? GERD (gastroesophageal reflux disease)   ? Hiatal hernia   ? Hypercholesteremia   ? Hypertension   ? Hyperthyroidism   ? Lung nodule seen on imaging study   ? OCD (obsessive compulsive disorder)   ? Pneumonia   ? ? ?Past Surgical History:  ?Procedure Laterality Date  ? ABDOMINAL HYSTERECTOMY    ? BREAST BIOPSY Left 11/23/2021  ? BREAST BIOPSY Left 12/28/2021  ? CESAREAN SECTION    ? CHOLECYSTECTOMY    ? COLONOSCOPY    ? around 2007 Tressie Ellis Flordia did have polyps  ? LAPAROSCOPIC ABDOMINAL EXPLORATION    ? UPPER GASTROINTESTINAL ENDOSCOPY    ? ? ?Family History  ?Problem Relation Age of Onset  ? Clotting disorder Mother   ?     TIA's  ? Diabetes type II Mother   ? Hypertension Mother   ? Hypertension Father   ? Diabetes type II Father   ? Heart Problems Father   ?  Hypertension Sister   ? Diabetes type II Sister   ? Heart murmur Brother   ? Asthma Brother   ? Asthma Daughter   ? ADD / ADHD Daughter   ? ADD / ADHD Son   ? Asthma Son   ? Cancer Paternal Uncle   ? Atrial fibrillation Maternal Grandmother   ? Cancer Maternal Grandfather   ? Cancer Paternal Grandfather   ? Diabetes Mellitus I Paternal Grandfather   ? Heart Problems Paternal Grandfather   ? Lung cancer Paternal Grandfather   ? Colon cancer Neg Hx   ? Rectal cancer Neg Hx   ? Stomach cancer Neg Hx   ? ? ?Social History  ? ?Socioeconomic History  ? Marital status: Married  ?  Spouse name: Harrell Gave  ? Number of children: 5  ? Years of education: Not on file  ? Highest education level: Not on file  ?Occupational History  ? Not on file  ?Tobacco Use  ? Smoking status: Every  Day  ?  Packs/day: 0.10  ?  Years: 1.00  ?  Pack years: 0.10  ?  Types: Cigarettes  ?  Passive exposure: Current  ? Smokeless tobacco: Never  ? Tobacco comments:  ?  2 a day as of 04/05/2022  ?Vaping Use  ? Vaping Use: Former  ?Substance and Sexual Activity  ? Alcohol use: Not Currently  ? Drug use: Not Currently  ? Sexual activity: Not Currently  ?  Birth control/protection: Surgical  ?  Comment: Partial hysterectomy  ?Other Topics Concern  ? Not on file  ?Social History Narrative  ? Patient is currently unemployed  ? CMA, has been doing customer service  ? Married  ? 5 children  ? [place]  ? 2000- [place]  ? 2006 [place]  ? 2010Dellie Catholic (son)  ?   ? 2 children local and 3 in [place]  ?   ? Enjoys crafts, playing with kids, plays piano  ? 1 cat and 1 dog  ? Completed associates degree  ? ?Social Determinants of Health  ? ?Financial Resource Strain: Not on file  ?Food Insecurity: Not on file  ?Transportation Needs: Not on file  ?Physical Activity: Inactive  ? Days of Exercise per Week: 0 days  ? Minutes of Exercise per Session: 0 min  ?Stress: Not on file  ?Social Connections: Not on file  ?Intimate Partner Violence: Not on file  ? ? ?Outpatient Medications Prior to Visit  ?Medication Sig Dispense Refill  ? ADVAIR DISKUS 250-50 MCG/ACT AEPB Inhale 1 puff into the lungs 2 (two) times daily. 60 each 2  ? albuterol (VENTOLIN HFA) 108 (90 Base) MCG/ACT inhaler Inhale 2 puffs into the lungs every 6 (six) hours as needed for wheezing or shortness of breath. 18 g 0  ? buPROPion (WELLBUTRIN SR) 150 MG 12 hr tablet TAKE 1 TABLET BY MOUTH TWICE A DAY 180 tablet 1  ? cyclobenzaprine (FEXMID) 7.5 MG tablet Take 7.5 mg by mouth daily.    ? DULoxetine (CYMBALTA) 30 MG capsule Take by mouth.    ? DULoxetine (CYMBALTA) 60 MG capsule Take 90 mg by mouth daily.    ? gabapentin (NEURONTIN) 100 MG capsule TAKE 1 CAPSULE (100 MG TOTAL) BY MOUTH THREE TIMES DAILY. 90 capsule 1  ? hydrochlorothiazide (HYDRODIURIL) 25 MG  tablet TAKE 1 TABLET (25 MG TOTAL) BY MOUTH DAILY. 90 tablet 1  ? hydroxychloroquine (PLAQUENIL) 200 MG tablet Take 2 tablets (400 mg total)  by mouth daily. 60 tablet 2  ? hydrOXYzine (ATARAX) 25 MG tablet TAKE 1 TABLET BY MOUTH THREE TIMES A DAY AS NEEDED FOR ANXIETY 42 tablet 0  ? hyoscyamine (LEVSIN SL) 0.125 MG SL tablet PLACE 1 TABLET UNDER THE TONGUE EVERY 6 HOURS AS NEEDED FOR CRAMPING. 30 tablet 1  ? ipratropium-albuterol (DUONEB) 0.5-2.5 (3) MG/3ML SOLN Take 3 mLs by nebulization every 4 (four) hours as needed. 72 mL 1  ? LATUDA 20 MG TABS tablet SMARTSIG:1 Tablet(s) By Mouth Every Evening    ? LORazepam (ATIVAN) 1 MG tablet One tablet by mouth once, 30 minutes prior to MRI- do not drive after taking. 1 tablet 0  ? lurasidone (LATUDA) 80 MG TABS tablet Take 80 mg by mouth daily.    ? Lurasidone HCl 60 MG TABS SMARTSIG:1 Tablet(s) By Mouth Every Evening    ? meclizine (ANTIVERT) 25 MG tablet Take 1 tablet (25 mg total) by mouth 3 (three) times daily as needed for dizziness. 30 tablet 0  ? methocarbamol (ROBAXIN) 500 MG tablet TAKE 1 TABLET BY MOUTH EVERY 8 HOURS AS NEEDED FOR MUSCLE SPASMS 30 tablet 0  ? nystatin (MYCOSTATIN/NYSTOP) powder Apply 1 application topically 3 (three) times daily. 60 g 1  ? omeprazole (PRILOSEC) 40 MG capsule TAKE 1 CAPSULE (40 MG TOTAL) BY MOUTH 2 (TWO) TIMES DAILY. PRILOSEC 40 MG TWICE DAILY FOR 6 WEEKS, THEN REDUCE TO 40 MG DAILY AND TITRATE TO LOWEST EFFECTIVE DOSE TO CONTROL REFLUX. 180 capsule 1  ? ondansetron (ZOFRAN) 4 MG tablet Take 1 tablet (4 mg total) by mouth every 8 (eight) hours as needed for nausea or vomiting. 30 tablet 1  ? ondansetron (ZOFRAN) 4 MG tablet Take by mouth.    ? ondansetron (ZOFRAN) 4 MG tablet TAKE 1 TABLET BY MOUTH EVERY 8 HOURS AS NEEDED FOR NAUSEA AND VOMITING 9 tablet 6  ? potassium chloride (KLOR-CON) 10 MEQ tablet Take 1 tablet (10 mEq total) by mouth daily. 90 tablet 0  ? propranolol (INDERAL) 40 MG tablet TAKE 1 TABLET (40 MG TOTAL) BY  MOUTH EVERY 8 (EIGHT) HOURS. 270 tablet 3  ? propranolol (INDERAL) 60 MG tablet Take 1 tablet (60 mg total) by mouth 2 (two) times daily as needed. 60 tablet 3  ? traZODone (DESYREL) 100 MG tablet TAKE 1-2

## 2022-04-19 ENCOUNTER — Telehealth: Payer: Self-pay | Admitting: Physical Therapy

## 2022-04-19 ENCOUNTER — Other Ambulatory Visit (HOSPITAL_COMMUNITY): Payer: Self-pay

## 2022-04-19 ENCOUNTER — Encounter: Payer: 59 | Admitting: Physical Therapy

## 2022-04-19 NOTE — Telephone Encounter (Signed)
Spoke with patient regarding missed PT appointment. Patient informed of missed appointment and due to attendance policy she will require a new PT referral in order to schedule re-evaluation. She has not returned to PT in over 6 weeks and has excessive appointment cancellations. Patient expressed understanding. ? ?Hilda Blades, PT, DPT, LAT, ATC ?04/19/22  10:59 AM ?Phone: [phone] ?Fax: [phone] ? ?

## 2022-04-21 ENCOUNTER — Other Ambulatory Visit: Payer: Self-pay | Admitting: Family

## 2022-04-26 ENCOUNTER — Telehealth: Payer: Self-pay | Admitting: Plastic Surgery

## 2022-04-26 NOTE — Telephone Encounter (Signed)
Called patient to find out if she has quit smoking because Dr. Marla Roe wanted her to be smoke free for 3 months before surgery. Patient has quit smoking but as of our conversation, [place] 05[place]05[place] and wanted surgery before then. Since we cannot get authorization completed and surgery scheduled by that date, patient has decided to move forward once she gets settled in her new place with a different plastic surgery office.  ?

## 2022-04-27 ENCOUNTER — Other Ambulatory Visit: Payer: Self-pay | Admitting: Gastroenterology

## 2022-04-27 ENCOUNTER — Other Ambulatory Visit: Payer: Self-pay | Admitting: Nurse Practitioner

## 2022-04-28 ENCOUNTER — Encounter: Payer: Self-pay | Admitting: Hematology & Oncology

## 2022-04-28 ENCOUNTER — Inpatient Hospital Stay (HOSPITAL_BASED_OUTPATIENT_CLINIC_OR_DEPARTMENT_OTHER): Payer: 59 | Admitting: Hematology & Oncology

## 2022-04-28 ENCOUNTER — Inpatient Hospital Stay: Payer: 59 | Attending: Hematology & Oncology

## 2022-04-28 VITALS — BP 99/73 | HR 71 | Temp 98.1°F | Resp 20 | Wt 215.0 lb

## 2022-04-28 DIAGNOSIS — R519 Headache, unspecified: Secondary | ICD-10-CM | POA: Insufficient documentation

## 2022-04-28 DIAGNOSIS — Z79899 Other long term (current) drug therapy: Secondary | ICD-10-CM | POA: Insufficient documentation

## 2022-04-28 DIAGNOSIS — R760 Raised antibody titer: Secondary | ICD-10-CM | POA: Diagnosis not present

## 2022-04-28 DIAGNOSIS — D6862 Lupus anticoagulant syndrome: Secondary | ICD-10-CM | POA: Insufficient documentation

## 2022-04-28 NOTE — Progress Notes (Signed)
?Hematology and Oncology Follow Up Visit ? ?Wanda Mays ?[id number] ?Nov 11, 1976 46 y.o. ?04/28/2022 ? ? ?Principle Diagnosis:  ?Positive lupus anticoagulant-no thromboembolic disease ? ?Current Therapy:   ?Observation ?    ?Interim History:  [name] is back for her second office visit.  We first saw her back in January.  At that time, she was sent here because she had a positive lupus anticoagulant test.  No other tests were positive.  She had a normal beta-2 glycoprotein and anticardiolipin antibody. ? ?The bad news is that she is moving up to [place].  They are leaving next week. ? ?She fell last week.  She hit her head.  She went to the emergency room.  Did not feel that there is need for any type of scan to be done.  She has been having some headaches.  She has been having some migraines. ? ?Again, she has never had a blood clot.  She has had 5 pregnancies.  She never had any miscarriages. ? ?She does have lupus.  She is on Plaquenil for this. ? ?Overall, I would have to say that her performance status is probably ECOG 1. ? ?Medications:  ?Current Outpatient Medications:  ?  ADVAIR DISKUS 250-50 MCG/ACT AEPB, Inhale 1 puff into the lungs 2 (two) times daily., Disp: 60 each, Rfl: 2 ?  albuterol (VENTOLIN HFA) 108 (90 Base) MCG/ACT inhaler, Inhale 2 puffs into the lungs every 6 (six) hours as needed for wheezing or shortness of breath., Disp: 18 g, Rfl: 0 ?  benztropine (COGENTIN) 0.5 MG tablet, Take 0.5 mg by mouth 2 (two) times daily., Disp: , Rfl:  ?  buPROPion (WELLBUTRIN SR) 150 MG 12 hr tablet, TAKE 1 TABLET BY MOUTH TWICE A DAY, Disp: 180 tablet, Rfl: 1 ?  cyclobenzaprine (FEXMID) 7.5 MG tablet, Take 7.5 mg by mouth daily., Disp: , Rfl:  ?  DULoxetine (CYMBALTA) 30 MG capsule, Take by mouth daily., Disp: , Rfl:  ?  DULoxetine (CYMBALTA) 60 MG capsule, Take 90 mg by mouth daily., Disp: , Rfl:  ?  Folate-B12-Intrinsic Factor (INTRINSI B12-FOLATE) [phone] MCG-MCG-MG TABS, Intrinsi B12-Folate, Disp: , Rfl:   ?  gabapentin (NEURONTIN) 100 MG capsule, TAKE 1 CAPSULE (100 MG TOTAL) BY MOUTH THREE TIMES DAILY., Disp: 90 capsule, Rfl: 1 ?  haloperidol (HALDOL) 5 MG tablet, Take by mouth every 8 (eight) hours as needed., Disp: , Rfl:  ?  hydrochlorothiazide (HYDRODIURIL) 25 MG tablet, TAKE 1 TABLET (25 MG TOTAL) BY MOUTH DAILY., Disp: 90 tablet, Rfl: 1 ?  hydroxychloroquine (PLAQUENIL) 200 MG tablet, Take 2 tablets (400 mg total) by mouth daily., Disp: 60 tablet, Rfl: 2 ?  hydrOXYzine (ATARAX) 25 MG tablet, TAKE 1 TABLET BY MOUTH THREE TIMES A DAY AS NEEDED FOR ANXIETY, Disp: 42 tablet, Rfl: 0 ?  hyoscyamine (LEVSIN SL) 0.125 MG SL tablet, PLACE 1 TABLET UNDER THE TONGUE EVERY 6 HOURS AS NEEDED FOR CRAMPING., Disp: 30 tablet, Rfl: 1 ?  ipratropium-albuterol (DUONEB) 0.5-2.5 (3) MG/3ML SOLN, Take 3 mLs by nebulization every 4 (four) hours as needed., Disp: 72 mL, Rfl: 1 ?  LATUDA 20 MG TABS tablet, SMARTSIG:1 Tablet(s) By Mouth Every Evening, Disp: , Rfl:  ?  lurasidone (LATUDA) 80 MG TABS tablet, Take 80 mg by mouth daily., Disp: , Rfl:  ?  methocarbamol (ROBAXIN) 500 MG tablet, TAKE 1 TABLET BY MOUTH EVERY 8 HOURS AS NEEDED FOR MUSCLE SPASMS, Disp: 30 tablet, Rfl: 0 ?  nystatin (MYCOSTATIN/NYSTOP) powder, APPLY TOPICALLY 3 TIMES  A DAY, Disp: 60 g, Rfl: 1 ?  nystatin cream (MYCOSTATIN), nystatin 100,000 unit/gram topical cream  APPLY TO AFFECTED AREA TWICE A DAY, Disp: , Rfl:  ?  omeprazole (PRILOSEC) 40 MG capsule, TAKE 1 CAPSULE (40 MG TOTAL) BY MOUTH 2 (TWO) TIMES DAILY. PRILOSEC 40 MG TWICE DAILY FOR 6 WEEKS, THEN REDUCE TO 40 MG DAILY AND TITRATE TO LOWEST EFFECTIVE DOSE TO CONTROL REFLUX., Disp: 180 capsule, Rfl: 1 ?  ondansetron (ZOFRAN) 4 MG tablet, TAKE 1 TABLET BY MOUTH EVERY 8 HOURS AS NEEDED FOR NAUSEA AND VOMITING, Disp: 9 tablet, Rfl: 6 ?  potassium chloride (KLOR-CON) 10 MEQ tablet, Take 1 tablet (10 mEq total) by mouth daily., Disp: 90 tablet, Rfl: 0 ?  propranolol (INDERAL) 40 MG tablet, TAKE 1 TABLET (40 MG  TOTAL) BY MOUTH EVERY 8 (EIGHT) HOURS., Disp: 270 tablet, Rfl: 3 ?  propranolol (INDERAL) 60 MG tablet, Take 1 tablet (60 mg total) by mouth 2 (two) times daily as needed., Disp: 60 tablet, Rfl: 3 ?  traZODone (DESYREL) 100 MG tablet, TAKE 1-2 TABLETS (100-200 MG TOTAL) BY MOUTH AT BEDTIME AS NEEDED., Disp: 90 tablet, Rfl: 0 ?  valproic acid (DEPAKENE) 250 MG capsule, Take 250 mg by mouth daily., Disp: , Rfl:  ?  zolmitriptan (ZOMIG-ZMT) 2.5 MG disintegrating tablet, Take 1 tablet (2.5 mg total) by mouth as needed for migraine., Disp: 14 tablet, Rfl: 0 ?  LORazepam (ATIVAN) 1 MG tablet, One tablet by mouth once, 30 minutes prior to MRI- do not drive after taking. (Patient not taking: Reported on 04/28/2022), Disp: 1 tablet, Rfl: 0 ?  meclizine (ANTIVERT) 25 MG tablet, Take 1 tablet (25 mg total) by mouth 3 (three) times daily as needed for dizziness. (Patient not taking: Reported on 04/28/2022), Disp: 30 tablet, Rfl: 0 ? ?Allergies:  ?Allergies  ?Allergen Reactions  ? Penicillins Hives  ? Aspirin   ? ? ?Past Medical History, Surgical history, Social history, and Family History were reviewed and updated. ? ?Review of Systems: ?Review of Systems  ?Constitutional:  Positive for fatigue. Negative for fever.  ?HENT:  Negative.    ?Eyes: Negative.   ?Respiratory: Negative.    ?Cardiovascular: Negative.   ?Gastrointestinal: Negative.   ?Endocrine: Negative.   ?Genitourinary: Negative.    ?Musculoskeletal:  Positive for arthralgias, back pain and myalgias.  ?Skin: Negative.   ?Neurological:  Positive for headaches.  ?Hematological: Negative.   ?Psychiatric/Behavioral: Negative.    ? ?Physical Exam: ? weight is 215 lb 0.6 oz (97.5 kg). Her temperature is 98.1 ?F (36.7 ?C). Her blood pressure is 99/73 and her pulse is 71. Her respiration is 20 and oxygen saturation is 95%.  ? ?Wt Readings from Last 3 Encounters:  ?04/28/22 215 lb 0.6 oz (97.5 kg)  ?04/13/22 216 lb (98 kg)  ?04/05/22 218 lb (98.9 kg)  ? ? ?Physical Exam ?Vitals  reviewed.  ?HENT:  ?   Head: Normocephalic and atraumatic.  ?Eyes:  ?   Pupils: Pupils are equal, round, and reactive to light.  ?Cardiovascular:  ?   Rate and Rhythm: Normal rate and regular rhythm.  ?   Heart sounds: Normal heart sounds.  ?Pulmonary:  ?   Effort: Pulmonary effort is normal.  ?   Breath sounds: Normal breath sounds.  ?Abdominal:  ?   General: Bowel sounds are normal.  ?   Palpations: Abdomen is soft.  ?Musculoskeletal:     ?   General: No tenderness or deformity. Normal range of motion.  ?  Cervical back: Normal range of motion.  ?Lymphadenopathy:  ?   Cervical: No cervical adenopathy.  ?Skin: ?   General: Skin is warm and dry.  ?   Findings: No erythema or rash.  ?Neurological:  ?   Mental Status: She is alert and oriented to person, place, and time.  ?Psychiatric:     ?   Behavior: Behavior normal.     ?   Thought Content: Thought content normal.     ?   Judgment: Judgment normal.  ? ? ? ?Lab Results  ?Component Value Date  ? WBC 7.9 10/03/2021  ? HGB 14.4 10/03/2021  ? HCT 42.2 10/03/2021  ? MCV 85.8 10/03/2021  ? PLT 306.0 10/03/2021  ? ?  Chemistry   ?   ?Component Value Date/Time  ? NA 135 09/13/2021 1353  ? NA 138 09/02/2021 1053  ? K 4.0 09/13/2021 1353  ? CL 102 09/13/2021 1353  ? CO2 24 09/13/2021 1353  ? BUN 13 09/13/2021 1353  ? BUN 13 09/02/2021 1053  ? CREATININE 0.76 09/13/2021 1353  ?    ?Component Value Date/Time  ? CALCIUM 9.5 09/13/2021 1353  ? ALKPHOS 64 09/13/2021 1353  ? AST 16 09/13/2021 1353  ? ALT 21 09/13/2021 1353  ? BILITOT 0.3 09/13/2021 1353  ?  ? ? ? ?Impression and Plan: ?Ms. Prisk is a very nice 46 year old white female.  She has a rheumatologic condition.  I suppose this is lupus.  She is on Plaquenil. ? ?She has a positive lupus anticoagulant.  We will test this today.  Again she has had no problems with thromboembolic disease.  She has had 5 pregnancies.  She never had miscarriage. ? ?Even though she has a lupus anticoagulant, there is absolutely no indication  that she needs any kind of anticoagulation.  I think she is shown [place] already that she does not have a hypercoagulable condition from the lupus anticoagulant.  I think if she would have had a problem wit

## 2022-04-29 ENCOUNTER — Other Ambulatory Visit: Payer: Self-pay | Admitting: Family

## 2022-04-29 LAB — CARDIOLIPIN ANTIBODIES, IGG, IGM, IGA
Anticardiolipin IgA: <9 APL U/mL (ref 0–11)
Anticardiolipin IgG: <9 GPL U/mL (ref 0–14)
Anticardiolipin IgM: 15 MPL U/mL — ABNORMAL HIGH (ref 0–12)

## 2022-04-30 LAB — BETA-2-GLYCOPROTEIN I ABS, IGG/M/A
Beta-2 Glyco I IgG: <9 GPI IgG units (ref 0–20)
Beta-2-Glycoprotein I IgA: <9 GPI IgA units (ref 0–25)
Beta-2-Glycoprotein I IgM: <9 GPI IgM units (ref 0–32)

## 2022-05-02 LAB — LUPUS ANTICOAGULANT PANEL
DRVVT: 41.5 s (ref 0.0–47.0)
PTT Lupus Anticoagulant: 42.6 s (ref 0.0–43.5)

## 2022-05-09 ENCOUNTER — Other Ambulatory Visit (HOSPITAL_COMMUNITY): Payer: Self-pay

## 2022-05-09 ENCOUNTER — Other Ambulatory Visit: Payer: Self-pay | Admitting: Family

## 2022-05-09 MED ORDER — ALBUTEROL SULFATE HFA 108 (90 BASE) MCG/ACT IN AERS
2.0000 | INHALATION_SPRAY | Freq: Four times a day (QID) | RESPIRATORY_TRACT | 0 refills | Status: AC | PRN
Start: 2022-05-09 — End: ?
  Filled 2022-05-09: qty 18, 25d supply, fill #0

## 2022-05-10 ENCOUNTER — Other Ambulatory Visit (HOSPITAL_COMMUNITY): Payer: Self-pay

## 2022-05-10 MED ORDER — METHOCARBAMOL 500 MG PO TABS
500.0000 mg | ORAL_TABLET | Freq: Three times a day (TID) | ORAL | 0 refills | Status: AC | PRN
  Filled 2022-05-10: qty 30, 10d supply, fill #0

## 2022-05-10 MED ORDER — TRAZODONE HCL 100 MG PO TABS
100.0000 mg | ORAL_TABLET | Freq: Every evening | ORAL | 0 refills | Status: DC | PRN
  Filled 2022-05-10: qty 90, 45d supply, fill #0

## 2022-05-10 MED ORDER — NYSTATIN 100000 UNIT/GM EX CREA
TOPICAL_CREAM | Freq: Two times a day (BID) | CUTANEOUS | 1 refills | Status: AC
Start: 2022-05-10 — End: 2022-05-17
  Filled 2022-05-10: qty 30, 7d supply, fill #0

## 2022-05-12 ENCOUNTER — Other Ambulatory Visit (HOSPITAL_COMMUNITY): Payer: Self-pay

## 2022-05-17 ENCOUNTER — Other Ambulatory Visit (HOSPITAL_COMMUNITY): Payer: Self-pay

## 2022-05-17 ENCOUNTER — Other Ambulatory Visit: Payer: Self-pay | Admitting: Family

## 2022-06-15 ENCOUNTER — Other Ambulatory Visit: Payer: Self-pay | Admitting: Family

## 2022-06-21 ENCOUNTER — Other Ambulatory Visit: Payer: Self-pay | Admitting: Family

## 2022-07-17 ENCOUNTER — Ambulatory Visit: Payer: 59 | Admitting: Internal Medicine

## 2022-07-27 ENCOUNTER — Ambulatory Visit (HOSPITAL_BASED_OUTPATIENT_CLINIC_OR_DEPARTMENT_OTHER): Payer: 59

## 2022-08-28 IMAGING — PT NM PET TUM IMG INITIAL (PI) SKULL BASE T - THIGH
7 series · 25 of 25 positions shown · non-contrast
Comparison: 09/13/2021 CTA chest.  Abdominopelvic CT of 09/14/2021.

CLINICAL DATA: Initial treatment strategy for left lower lobe
pulmonary nodule on CT. Recent chest pain and shortness of breath.
Hematochezia.

EXAM:
NUCLEAR MEDICINE PET SKULL BASE TO THIGH
TECHNIQUE: 10.6 mCi F-18 FDG was injected intravenously. Full-ring PET imaging
was performed from the skull base to thigh after the radiotracer. CT
data was obtained and used for attenuation correction and anatomic
localization.
Fasting blood glucose: 121 mg/dl

[Series 3: pet sk_thigh ac · axial · 5.0mm · 4.07mm/px · z∈[-1378,-534]mm · 6 of 212 slices shown]
[im 1/212]
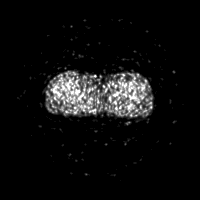
[im 43/212]
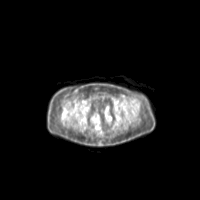
[im 85/212]
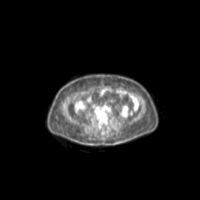
[im 127/212]
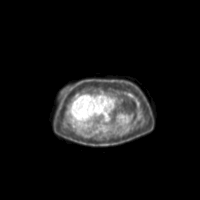
[im 169/212]
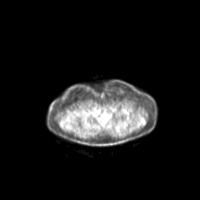
[im 212/212]
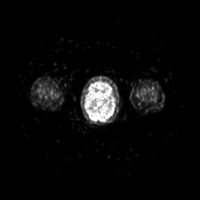

[Series 4: ct sk_thigh 5.0 bf37 · axial · 5.0mm · 0.98mm/px · z∈[-1378,-534]mm · 5 of 212 slices shown]
[im 1/212]
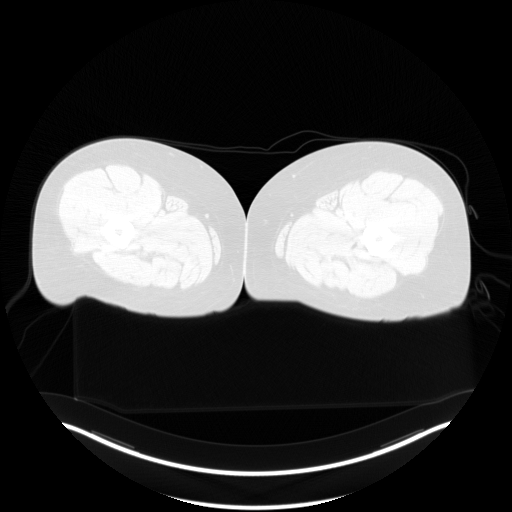
[im 53/212]
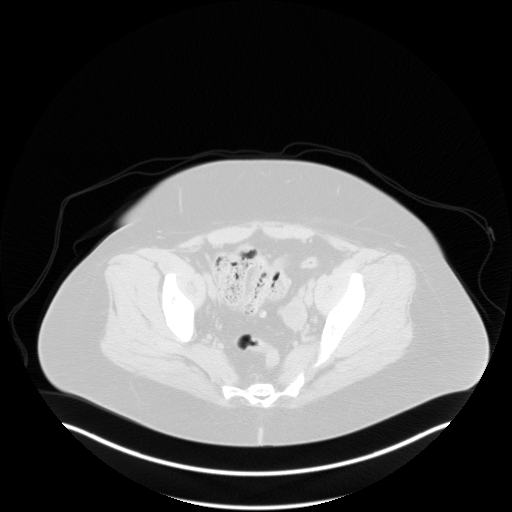
[im 106/212]
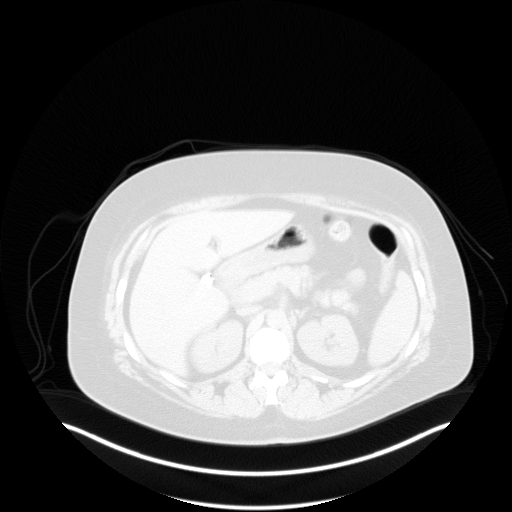
[im 159/212]
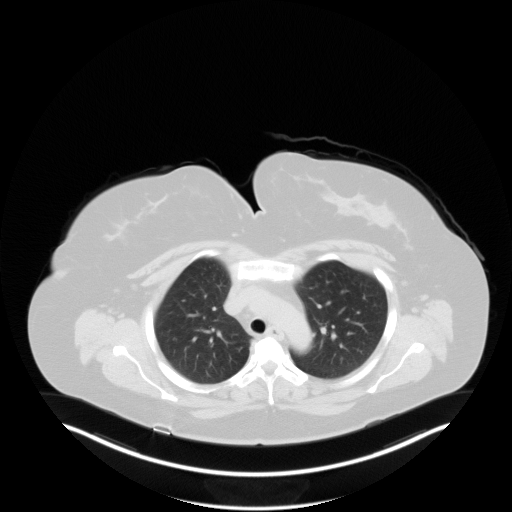
[im 212/212  brain]
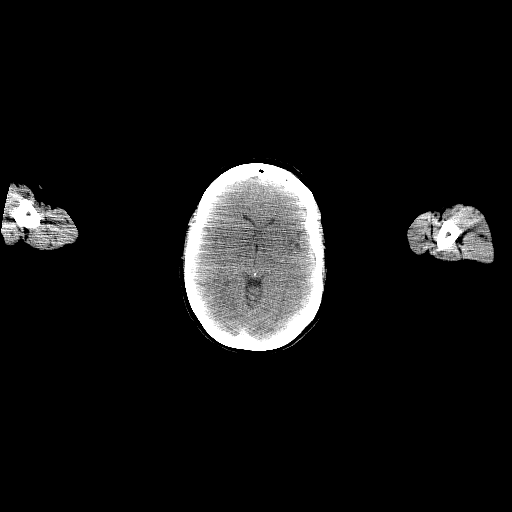

[Series 5: pet sk_thigh nac · axial · 5.0mm · 4.07mm/px · z∈[-1378,-534]mm · 5 of 212 slices shown]
[im 1/212]
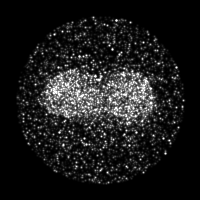
[im 53/212]
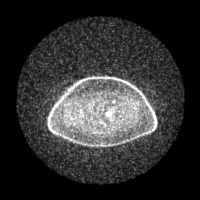
[im 106/212]
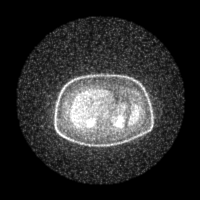
[im 159/212]
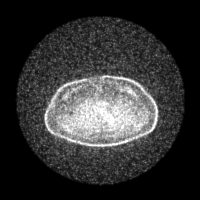
[im 212/212]
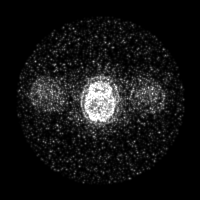

[Series 8: ct sk_thigh 5.0 br59 lung_bone · axial · 5.0mm · 0.68mm/px · z∈[-951,-671]mm · 2 of 71 slices shown]
[im 1/71  brain]
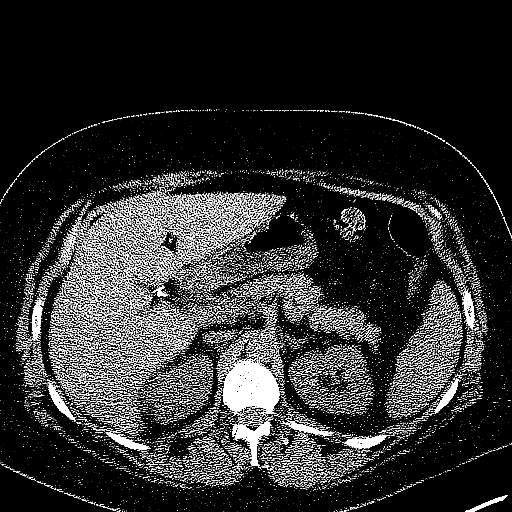
[im 71/71  brain]
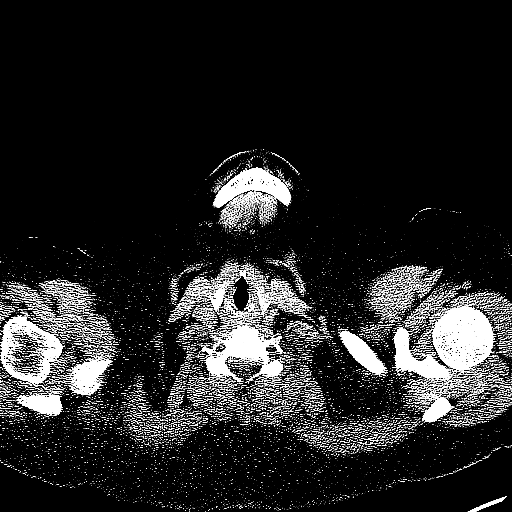

[Series 603: fused cor · 1 of 52 slices shown]
[im 1/52]
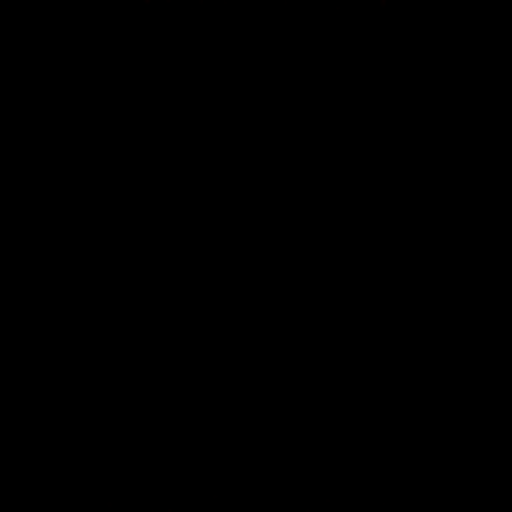

[Series 604: <mip collection> · coronal · 1.75mm/px · 1 of 32 slices shown]
[im 1/32]
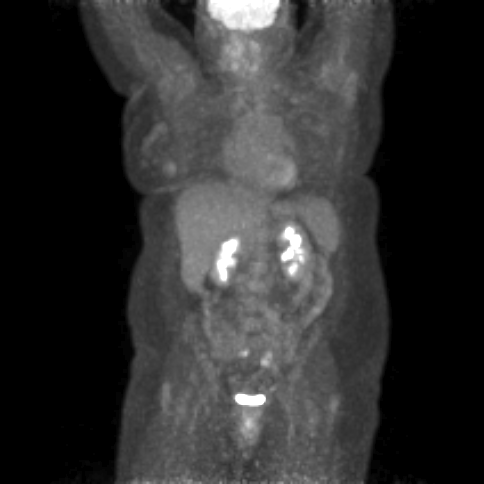

[Series 605: range-ac ct sk_thigh 5.0 hd_fov-tra-<alpha range> · 5 of 207 slices shown]
[im 1/207]
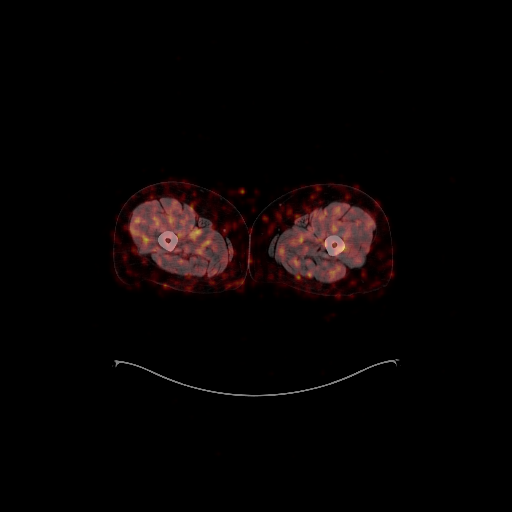
[im 52/207]
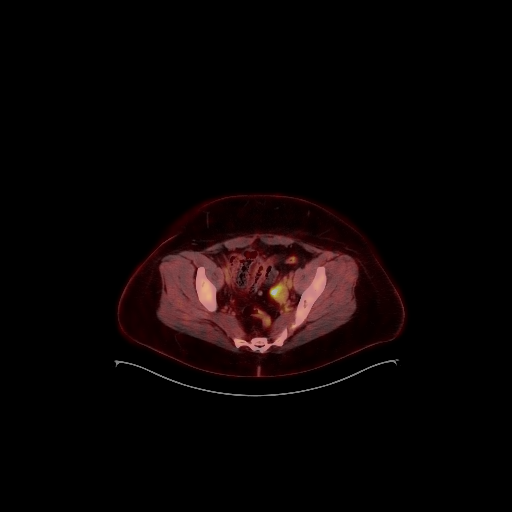
[im 104/207]
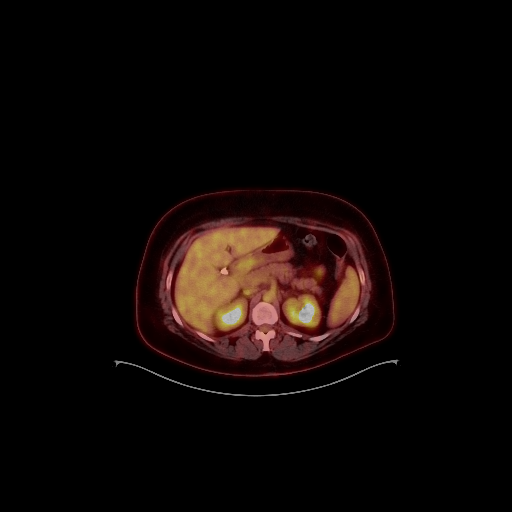
[im 155/207]
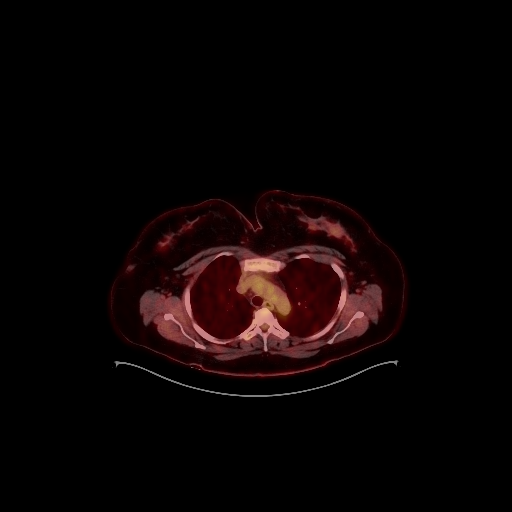
[im 207/207]
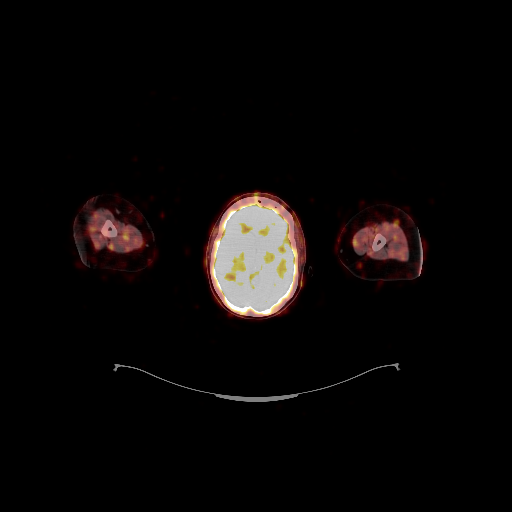

[25 of 25 positions shown; findings below may reference images not displayed]

FINDINGS: Mediastinal blood pool activity: SUV max

Liver activity: SUV max NA

NECK: No areas of abnormal hypermetabolism.

Incidental CT findings: Multiple small bilateral cervical nodes are
likely reactive.

Left maxillary sinus mucous retention cyst or polyp.

CHEST: No thoracic nodal hypermetabolism. The inferior left lower
lobe pleural based nodule is not hypermetabolic. Similar at 12 mm on
60/8.

Incidental CT findings: Tiny hiatal hernia. Borderline cardiomegaly.

ABDOMEN/PELVIS: Bilateral symmetric ovarian hypermetabolism is
likely physiologic. No abdominopelvic nodal hypermetabolism.

Incidental CT findings: Cholecystectomy. Motion degradation. Cecum
is redundant and terminates in the left upper pelvis. The appendix
is positioned left of midline. Hysterectomy. Fat containing ventral
pelvic wall hernia.

SKELETON: No abnormal marrow activity.

Incidental CT findings: none
IMPRESSION: 1. The left lower lobe pulmonary nodule is not hypermetabolic. This
is reassuring, but as some primary bronchogenic carcinomas can be
non FDG avid, chest CT follow-up at 3 months is recommended.
2. Incidental findings, including: Tiny hiatal hernia. Possible
constipation. Sinus disease.

## 2023-06-01 ENCOUNTER — Telehealth: Payer: Self-pay | Admitting: Family

## 2023-06-01 NOTE — Telephone Encounter (Signed)
Sure, let's set up a visit when I get back from vacation please.  I should see her first before I send the referral so I can give them updated information.

## 2023-06-01 NOTE — Telephone Encounter (Signed)
Patients husband has called stating his wife Is having a lupus flare up and they are wanting to get the PCP to sign a referral being faxed from [facility] [place] [facility]. Also wanting to know if they can set up a virtual appointment with the provider about what's going on. Please advise.   Husband phone number: [phone]

## 2023-06-04 NOTE — Telephone Encounter (Signed)
Called patient's husband and he mentioned that they are in [place]. He was notified we can not do a out of state visit.  He reports they are living there now. Advised to seen primary care and to let us know if records needed to be forwarded.
